# Patient Record
Sex: Male | Born: 1965 | Race: Black or African American | Hispanic: No | Marital: Married | Smoking: Current every day smoker
Health system: Southern US, Community
[De-identification: ages and names within clinical notes are randomized; demographics above are authoritative.]

## PROBLEM LIST (undated history)

## (undated) DIAGNOSIS — M329 Systemic lupus erythematosus, unspecified: Secondary | ICD-10-CM

## (undated) DIAGNOSIS — I200 Unstable angina: Secondary | ICD-10-CM

## (undated) DIAGNOSIS — E785 Hyperlipidemia, unspecified: Secondary | ICD-10-CM

## (undated) DIAGNOSIS — I10 Essential (primary) hypertension: Secondary | ICD-10-CM

## (undated) DIAGNOSIS — I2510 Atherosclerotic heart disease of native coronary artery without angina pectoris: Secondary | ICD-10-CM

## (undated) DIAGNOSIS — Z9861 Coronary angioplasty status: Secondary | ICD-10-CM

## (undated) DIAGNOSIS — I219 Acute myocardial infarction, unspecified: Secondary | ICD-10-CM

## (undated) HISTORY — PX: TOTAL HIP ARTHROPLASTY: SHX124

## (undated) HISTORY — PX: TOTAL SHOULDER REPLACEMENT: SUR1217

## (undated) HISTORY — PX: CORONARY ANGIOPLASTY WITH STENT PLACEMENT: SHX49

---

## 2005-08-13 DIAGNOSIS — I219 Acute myocardial infarction, unspecified: Secondary | ICD-10-CM

## 2005-08-13 HISTORY — DX: Acute myocardial infarction, unspecified: I21.9

## 2014-07-07 DIAGNOSIS — I200 Unstable angina: Secondary | ICD-10-CM

## 2014-07-07 HISTORY — DX: Unstable angina: I20.0

## 2014-11-08 ENCOUNTER — Emergency Department (HOSPITAL_BASED_OUTPATIENT_CLINIC_OR_DEPARTMENT_OTHER): Payer: Medicare Other

## 2014-11-08 ENCOUNTER — Other Ambulatory Visit (HOSPITAL_BASED_OUTPATIENT_CLINIC_OR_DEPARTMENT_OTHER): Payer: Self-pay

## 2014-11-08 ENCOUNTER — Inpatient Hospital Stay (HOSPITAL_BASED_OUTPATIENT_CLINIC_OR_DEPARTMENT_OTHER)
Admission: EM | Admit: 2014-11-08 | Discharge: 2014-11-10 | DRG: 247 | Disposition: A | Discharge status: Home / Self Care | Payer: Medicare Other | Attending: Internal Medicine | Admitting: Internal Medicine

## 2014-11-08 ENCOUNTER — Other Ambulatory Visit: Payer: Self-pay

## 2014-11-08 ENCOUNTER — Encounter (HOSPITAL_BASED_OUTPATIENT_CLINIC_OR_DEPARTMENT_OTHER): Payer: Self-pay

## 2014-11-08 DIAGNOSIS — T82857A Stenosis of cardiac prosthetic devices, implants and grafts, initial encounter: Secondary | ICD-10-CM | POA: Diagnosis present

## 2014-11-08 DIAGNOSIS — R10815 Periumbilic abdominal tenderness: Secondary | ICD-10-CM

## 2014-11-08 DIAGNOSIS — Z7982 Long term (current) use of aspirin: Secondary | ICD-10-CM | POA: Diagnosis not present

## 2014-11-08 DIAGNOSIS — F17210 Nicotine dependence, cigarettes, uncomplicated: Secondary | ICD-10-CM | POA: Diagnosis present

## 2014-11-08 DIAGNOSIS — Z96619 Presence of unspecified artificial shoulder joint: Secondary | ICD-10-CM | POA: Diagnosis present

## 2014-11-08 DIAGNOSIS — M329 Systemic lupus erythematosus, unspecified: Secondary | ICD-10-CM | POA: Diagnosis present

## 2014-11-08 DIAGNOSIS — I252 Old myocardial infarction: Secondary | ICD-10-CM | POA: Diagnosis not present

## 2014-11-08 DIAGNOSIS — R109 Unspecified abdominal pain: Secondary | ICD-10-CM

## 2014-11-08 DIAGNOSIS — I2510 Atherosclerotic heart disease of native coronary artery without angina pectoris: Secondary | ICD-10-CM | POA: Diagnosis present

## 2014-11-08 DIAGNOSIS — Z96649 Presence of unspecified artificial hip joint: Secondary | ICD-10-CM | POA: Diagnosis present

## 2014-11-08 DIAGNOSIS — Z9861 Coronary angioplasty status: Secondary | ICD-10-CM | POA: Diagnosis not present

## 2014-11-08 DIAGNOSIS — Z955 Presence of coronary angioplasty implant and graft: Secondary | ICD-10-CM

## 2014-11-08 DIAGNOSIS — I10 Essential (primary) hypertension: Secondary | ICD-10-CM | POA: Diagnosis present

## 2014-11-08 DIAGNOSIS — E876 Hypokalemia: Secondary | ICD-10-CM | POA: Diagnosis not present

## 2014-11-08 DIAGNOSIS — I214 Non-ST elevation (NSTEMI) myocardial infarction: Secondary | ICD-10-CM | POA: Diagnosis present

## 2014-11-08 DIAGNOSIS — Y831 Surgical operation with implant of artificial internal device as the cause of abnormal reaction of the patient, or of later complication, without mention of misadventure at the time of the procedure: Secondary | ICD-10-CM | POA: Diagnosis present

## 2014-11-08 DIAGNOSIS — R079 Chest pain, unspecified: Secondary | ICD-10-CM | POA: Diagnosis present

## 2014-11-08 DIAGNOSIS — E785 Hyperlipidemia, unspecified: Secondary | ICD-10-CM | POA: Diagnosis present

## 2014-11-08 DIAGNOSIS — G8929 Other chronic pain: Secondary | ICD-10-CM | POA: Diagnosis present

## 2014-11-08 DIAGNOSIS — Z7902 Long term (current) use of antithrombotics/antiplatelets: Secondary | ICD-10-CM

## 2014-11-08 DIAGNOSIS — T82855A Stenosis of coronary artery stent, initial encounter: Secondary | ICD-10-CM | POA: Diagnosis present

## 2014-11-08 HISTORY — DX: Essential (primary) hypertension: I10

## 2014-11-08 HISTORY — DX: Coronary angioplasty status: Z98.61

## 2014-11-08 HISTORY — DX: Systemic lupus erythematosus, unspecified: M32.9

## 2014-11-08 HISTORY — DX: Atherosclerotic heart disease of native coronary artery without angina pectoris: I25.10

## 2014-11-08 HISTORY — DX: Hyperlipidemia, unspecified: E78.5

## 2014-11-08 HISTORY — DX: Unstable angina: I20.0

## 2014-11-08 HISTORY — DX: Acute myocardial infarction, unspecified: I21.9

## 2014-11-08 LAB — CBC WITH DIFFERENTIAL/PLATELET
BASOS PCT: 0 % (ref 0–1)
Basophils Absolute: 0 10*3/uL (ref 0.0–0.1)
EOS ABS: 0 10*3/uL (ref 0.0–0.7)
Eosinophils Relative: 0 % (ref 0–5)
HCT: 38.9 % — ABNORMAL LOW (ref 39.0–52.0)
Hemoglobin: 13.2 g/dL (ref 13.0–17.0)
Lymphocytes Relative: 14 % (ref 12–46)
Lymphs Abs: 1.4 10*3/uL (ref 0.7–4.0)
MCH: 29.9 pg (ref 26.0–34.0)
MCHC: 33.9 g/dL (ref 30.0–36.0)
MCV: 88 fL (ref 78.0–100.0)
MONO ABS: 0.7 10*3/uL (ref 0.1–1.0)
Monocytes Relative: 7 % (ref 3–12)
NEUTROS PCT: 79 % — AB (ref 43–77)
Neutro Abs: 8.1 10*3/uL — ABNORMAL HIGH (ref 1.7–7.7)
Platelets: 206 10*3/uL (ref 150–400)
RBC: 4.42 MIL/uL (ref 4.22–5.81)
RDW: 14.2 % (ref 11.5–15.5)
WBC: 10.2 10*3/uL (ref 4.0–10.5)

## 2014-11-08 LAB — URINALYSIS, ROUTINE W REFLEX MICROSCOPIC
BILIRUBIN URINE: NEGATIVE
Glucose, UA: NEGATIVE mg/dL
Hgb urine dipstick: NEGATIVE
Ketones, ur: 40 mg/dL — AB
LEUKOCYTES UA: NEGATIVE
NITRITE: NEGATIVE
Protein, ur: 30 mg/dL — AB
Specific Gravity, Urine: 1.033 — ABNORMAL HIGH (ref 1.005–1.030)
Urobilinogen, UA: 0.2 mg/dL (ref 0.0–1.0)
pH: 5 (ref 5.0–8.0)

## 2014-11-08 LAB — COMPREHENSIVE METABOLIC PANEL
ALBUMIN: 4.8 g/dL (ref 3.5–5.0)
ALT: 21 U/L (ref 17–63)
AST: 41 U/L (ref 15–41)
Alkaline Phosphatase: 87 U/L (ref 38–126)
Anion gap: 14 (ref 5–15)
BILIRUBIN TOTAL: 0.5 mg/dL (ref 0.3–1.2)
BUN: 23 mg/dL — ABNORMAL HIGH (ref 6–20)
CHLORIDE: 105 mmol/L (ref 101–111)
CO2: 19 mmol/L — AB (ref 22–32)
Calcium: 9.3 mg/dL (ref 8.9–10.3)
Creatinine, Ser: 1.11 mg/dL (ref 0.61–1.24)
GFR calc Af Amer: >60 mL/min (ref >60)
GFR calc non Af Amer: >60 mL/min (ref >60)
GLUCOSE: 137 mg/dL — AB (ref 70–99)
Potassium: 4.5 mmol/L (ref 3.5–5.1)
Sodium: 138 mmol/L (ref 135–145)
Total Protein: 9.4 g/dL — ABNORMAL HIGH (ref 6.5–8.1)

## 2014-11-08 LAB — TROPONIN I
TROPONIN I: 0.63 ng/mL — AB (ref <0.031)
Troponin I: 1.15 ng/mL (ref <0.031)
Troponin I: 1.69 ng/mL (ref <0.031)

## 2014-11-08 LAB — URINE MICROSCOPIC-ADD ON

## 2014-11-08 LAB — CBG MONITORING, ED: GLUCOSE-CAPILLARY: 119 mg/dL — AB (ref 70–99)

## 2014-11-08 LAB — OCCULT BLOOD GASTRIC / DUODENUM (SPECIMEN CUP)
Occult Blood, Gastric: POSITIVE — AB
PH, GASTRIC: 4

## 2014-11-08 LAB — LIPASE, BLOOD: LIPASE: 24 U/L (ref 22–51)

## 2014-11-08 MED ORDER — HYDROCORTISONE 1 % EX OINT
1.0000 "application " | TOPICAL_OINTMENT | Freq: Every day | CUTANEOUS | Status: DC
  Administered 2014-11-08: 1 via TOPICAL
  Filled 2014-11-08: qty 28.35

## 2014-11-08 MED ORDER — CLOPIDOGREL BISULFATE 75 MG PO TABS
75.0000 mg | ORAL_TABLET | Freq: Every day | ORAL | Status: DC
  Administered 2014-11-09: 75 mg via ORAL
  Filled 2014-11-08 (×2): qty 1

## 2014-11-08 MED ORDER — ONDANSETRON HCL 4 MG/2ML IJ SOLN
4.0000 mg | Freq: Four times a day (QID) | INTRAMUSCULAR | Status: DC | PRN
  Administered 2014-11-08: 4 mg via INTRAVENOUS
  Filled 2014-11-08: qty 2

## 2014-11-08 MED ORDER — HYDROXYCHLOROQUINE SULFATE 200 MG PO TABS
200.0000 mg | ORAL_TABLET | Freq: Two times a day (BID) | ORAL | Status: DC
  Administered 2014-11-08 – 2014-11-09 (×3): 200 mg via ORAL
  Filled 2014-11-08 (×5): qty 1

## 2014-11-08 MED ORDER — NITROGLYCERIN 0.4 MG SL SUBL: 0.4000 mg | SUBLINGUAL_TABLET | SUBLINGUAL | Status: DC | PRN

## 2014-11-08 MED ORDER — MORPHINE SULFATE 4 MG/ML IJ SOLN
4.0000 mg | Freq: Once | INTRAMUSCULAR | Status: AC
  Administered 2014-11-08: 4 mg via INTRAVENOUS
  Filled 2014-11-08: qty 1

## 2014-11-08 MED ORDER — IOHEXOL 300 MG/ML  SOLN
100.0000 mL | Freq: Once | INTRAMUSCULAR | Status: AC | PRN
Start: 2014-11-08 — End: 2014-11-08
  Administered 2014-11-08: 100 mL via INTRAVENOUS

## 2014-11-08 MED ORDER — HEPARIN SODIUM (PORCINE) 5000 UNIT/ML IJ SOLN: 4000.0000 [IU] | Freq: Once | INTRAMUSCULAR | Status: DC

## 2014-11-08 MED ORDER — ASPIRIN 81 MG PO CHEW
324.0000 mg | CHEWABLE_TABLET | Freq: Once | ORAL | Status: AC
  Administered 2014-11-08: 324 mg via ORAL
  Filled 2014-11-08: qty 4

## 2014-11-08 MED ORDER — ALPRAZOLAM 0.25 MG PO TABS: 0.2500 mg | ORAL_TABLET | Freq: Two times a day (BID) | ORAL | Status: DC | PRN

## 2014-11-08 MED ORDER — ASPIRIN EC 81 MG PO TBEC
162.0000 mg | DELAYED_RELEASE_TABLET | Freq: Every day | ORAL | Status: DC
  Administered 2014-11-09: 162 mg via ORAL
  Filled 2014-11-08 (×3): qty 2

## 2014-11-08 MED ORDER — HEPARIN BOLUS VIA INFUSION
4000.0000 [IU] | Freq: Once | INTRAVENOUS | Status: AC
  Administered 2014-11-08: 4000 [IU] via INTRAVENOUS

## 2014-11-08 MED ORDER — PRAVASTATIN SODIUM 40 MG PO TABS
40.0000 mg | ORAL_TABLET | Freq: Every day | ORAL | Status: DC
  Administered 2014-11-09: 21:00:00 40 mg via ORAL
  Filled 2014-11-08: qty 1

## 2014-11-08 MED ORDER — SODIUM CHLORIDE 0.9 % IV BOLUS (SEPSIS)
1000.0000 mL | Freq: Once | INTRAVENOUS | Status: AC
  Administered 2014-11-08: 1000 mL via INTRAVENOUS

## 2014-11-08 MED ORDER — CLOBETASOL PROPIONATE 0.05 % EX CREA
1.0000 "application " | TOPICAL_CREAM | Freq: Every day | CUTANEOUS | Status: DC
  Administered 2014-11-08: 1 via TOPICAL
  Filled 2014-11-08: qty 15

## 2014-11-08 MED ORDER — HEPARIN (PORCINE) IN NACL 100-0.45 UNIT/ML-% IJ SOLN
INTRAMUSCULAR | Status: AC
  Filled 2014-11-08: qty 250

## 2014-11-08 MED ORDER — LISINOPRIL 10 MG PO TABS
20.0000 mg | ORAL_TABLET | Freq: Every day | ORAL | Status: DC
  Administered 2014-11-09: 20 mg via ORAL
  Filled 2014-11-08: qty 2
  Filled 2014-11-08: qty 1
  Filled 2014-11-08: qty 2

## 2014-11-08 MED ORDER — METOPROLOL SUCCINATE ER 50 MG PO TB24
50.0000 mg | ORAL_TABLET | Freq: Every day | ORAL | Status: DC
  Administered 2014-11-09: 50 mg via ORAL
  Filled 2014-11-08 (×2): qty 1

## 2014-11-08 MED ORDER — ZOLPIDEM TARTRATE 5 MG PO TABS
5.0000 mg | ORAL_TABLET | Freq: Every evening | ORAL | Status: DC | PRN
  Administered 2014-11-08: 5 mg via ORAL
  Filled 2014-11-08: qty 1

## 2014-11-08 MED ORDER — GABAPENTIN 600 MG PO TABS
600.0000 mg | ORAL_TABLET | Freq: Three times a day (TID) | ORAL | Status: DC
  Administered 2014-11-08 – 2014-11-09 (×4): 600 mg via ORAL
  Filled 2014-11-08 (×7): qty 1

## 2014-11-08 MED ORDER — DULOXETINE HCL 60 MG PO CPEP
60.0000 mg | ORAL_CAPSULE | Freq: Every day | ORAL | Status: DC
  Administered 2014-11-09: 60 mg via ORAL
  Filled 2014-11-08 (×2): qty 1

## 2014-11-08 MED ORDER — HEPARIN (PORCINE) IN NACL 100-0.45 UNIT/ML-% IJ SOLN
1150.0000 [IU]/h | INTRAMUSCULAR | Status: DC
  Administered 2014-11-08: 1150 [IU]/h via INTRAVENOUS
  Filled 2014-11-08: qty 250

## 2014-11-08 MED ORDER — CLOPIDOGREL BISULFATE 75 MG PO TABS
75.0000 mg | ORAL_TABLET | Freq: Once | ORAL | Status: AC
  Administered 2014-11-08: 75 mg via ORAL
  Filled 2014-11-08: qty 1

## 2014-11-08 MED ORDER — ACETAMINOPHEN 325 MG PO TABS: 650.0000 mg | ORAL_TABLET | ORAL | Status: DC | PRN

## 2014-11-08 MED ORDER — OXYCODONE HCL 5 MG PO TABS: 5.0000 mg | ORAL_TABLET | ORAL | Status: DC | PRN

## 2014-11-08 MED ORDER — ONDANSETRON HCL 4 MG/2ML IJ SOLN
4.0000 mg | Freq: Once | INTRAMUSCULAR | Status: AC
Start: 2014-11-08 — End: 2014-11-08
  Administered 2014-11-08: 4 mg via INTRAVENOUS
  Filled 2014-11-08: qty 2

## 2014-11-08 MED ORDER — TIZANIDINE HCL 4 MG PO TABS
4.0000 mg | ORAL_TABLET | Freq: Three times a day (TID) | ORAL | Status: DC
  Administered 2014-11-08 – 2014-11-09 (×4): 4 mg via ORAL
  Filled 2014-11-08 (×9): qty 1

## 2014-11-08 MED ORDER — IOHEXOL 300 MG/ML  SOLN
25.0000 mL | Freq: Once | INTRAMUSCULAR | Status: AC | PRN
  Administered 2014-11-08: 25 mL via ORAL

## 2014-11-08 NOTE — ED Notes (Addendum)
Dr. Jodi Mourningzavitz and Sue LushAndrea, primary nurse, alerted to critical troponin value of 0.63.

## 2014-11-08 NOTE — Progress Notes (Signed)
Paged Cardiology Fellow Varo with trop of 1.69

## 2014-11-08 NOTE — ED Notes (Signed)
MD aware trop. Marland Kitchen.6

## 2014-11-08 NOTE — H&P (Signed)
History and Physical   Patient ID: Christopher Nunez MRN: [id number], DOB/AGE: 48-04-1966 48 y.o. Date of Encounter: 11/08/2014  Primary Physician: Ananias Pilgrim, MD Primary Cardiologist: Dr Desma Maxim  Chief Complaint:  Abd pain, elevated enzymes  HPI: Christopher Nunez is a 49 y.o. male with a history of CAD. Last pm, he developed chills, diaphoresis, N&V. The symptoms lasted all night long. Seen by EMS in the night, but not transported. CBG may have been low. No diarrhea.   Pt had noticed a burning sensation with exertion for about a week, total of 2 episodes. He rested and used SL NTG spray with relief of symptoms. Previous cardiac symptoms was throbbing, heart beating fast.   Pt developed pain in the lower part of his abdomen from all the vomiting but did not have any chest discomfort.   When the diaphoresis, N&V did not resolve, a friend took he and his wife to [facility]. His cardiac enzymes were elevated and trending up, so he was transferred to [facility]. He has not eaten today. The last time he vomited was just before he left [facility].    Past Medical History  Diagnosis Date  . Hypertension   . Myocardial infarction 08/13/2005    Stent prob to RCA  . Lupus   . Unstable angina pectoris 07/07/2014    2.5 x 18 mm Resolute Integrity DES to RCA, for ISR per wife    Surgical History:  Past Surgical History  Procedure Laterality Date  . Coronary angioplasty with stent placement      2005 & 2015  . Total hip arthroplasty    . Total shoulder replacement       I have reviewed the patient's current medications. Prior to Admission medications    Prior to Admission medications   Medication Sig Start Date End Date Taking? Authorizing Provider  gabapentin (NEURONTIN) 600 MG tablet Take 600 mg by mouth 3 (three) times daily.   Yes Historical Provider, MD  hydroxychloroquine (PLAQUENIL) 200 MG tablet Take 200 mg by mouth 2 (two) times daily.   Yes Historical Provider, MD  lisinopril  (PRINIVIL,ZESTRIL) 20 MG tablet Take 20 mg by mouth daily.   Yes Historical Provider, MD  tiZANidine (ZANAFLEX) 4 MG tablet Take 4 mg by mouth 3 (three) times daily as needed for muscle spasms.    Yes Historical Provider, MD  clopidogrel (PLAVIX) 75 MG tablet Take 75 mg by mouth daily.    Historical Provider, MD  DULoxetine (CYMBALTA) 60 MG capsule Take 60 mg by mouth daily.    Historical Provider, MD  metoprolol succinate (TOPROL-XL) 50 MG 24 hr tablet Take 50 mg by mouth daily. Take with or immediately following a meal.    Historical Provider, MD  nitroGLYCERIN (NITROLINGUAL) 0.4 MG/SPRAY spray Place 1 spray under the tongue every 5 (five) minutes x 3 doses as needed for chest pain.    Historical Provider, MD  oxycodone (OXY-IR) 5 MG capsule Take 5 mg by mouth every 4 (four) hours as needed.    Historical Provider, MD  pravastatin (PRAVACHOL) 40 MG tablet Take 40 mg by mouth daily.    Historical Provider, MD    Scheduled Meds: . heparin       Continuous Infusions: . heparin 1,150 Units/hr (11/08/14 1602)   PRN Meds:.  Allergies:  Allergies  Allergen Reactions  . Hydrocodone Itching    History   Social History  . Marital Status: Married    Spouse Name: N/A  . Number of Children:  N/A  . Years of Education: N/A   Occupational History  . Disabled     since 2007   Social History Main Topics  . Smoking status: Current Every Day Smoker -- 0.50 packs/day for 30 years    Types: Cigarettes  . Smokeless tobacco: Never Used  . Alcohol Use: 0.0 oz/week    0 Standard drinks or equivalent per week     Comment: occasionally  . Drug Use: No  . Sexual Activity: Not on file   Other Topics Concern  . Not on file   Social History Narrative   Lives with wife. No cardiac problems in parents or siblings.    No family history on file. Family Status  Relation Status Death Age  . Mother Alive   . Father Alive     Review of Systems:   Full 14-point review of systems otherwise  negative except as noted above.  Physical Exam: Blood pressure 150/92, pulse 68, temperature 98.9 F (37.2 C), temperature source Oral, resp. rate 18, height 6\' 1"  (1.854 m), weight 175 lb 14.8 oz (79.8 kg), SpO2 100 %. General: Well developed, well nourished,male in no acute distress. Head: Normocephalic, atraumatic, sclera non-icteric, no xanthomas, nares are without discharge. Dentition:  Neck: No carotid bruits. JVD not elevated. No thyromegally Lungs: Good expansion bilaterally. without wheezes or rhonchi.  Heart: IRRegular rate and rhythm with S1 S2.  No S3 or S4.  No murmur, no rubs, or gallops appreciated. Abdomen: Soft, non-tender, non-distended with normoactive bowel sounds. No hepatomegaly. No rebound/guarding. No obvious abdominal masses. Msk:  Strength and tone appear normal for age. No joint deformities or effusions, no spine or costo-vertebral angle tenderness. Extremities: No clubbing or cyanosis. No edema.  Distal pedal pulses are 2+ in 4 extrem Neuro: Alert and oriented X 3. Moves all extremities spontaneously. No focal deficits noted. Psych:  Responds to questions appropriately with a normal affect. Skin: No rashes or lesions noted  Labs:   Lab Results  Component Value Date   WBC 10.2 11/08/2014   HGB 13.2 11/08/2014   HCT 38.9* 11/08/2014   MCV 88.0 11/08/2014   PLT 206 11/08/2014    Recent Labs Lab 11/08/14 1225  NA 138  K 4.5  CL 105  CO2 19*  BUN 23*  CREATININE 1.11  CALCIUM 9.3  PROT 9.4*  BILITOT 0.5  ALKPHOS 87  ALT 21  AST 41  GLUCOSE 137*    Recent Labs  11/08/14 1225 11/08/14 1455  TROPONINI 0.63* 1.15*   Radiology/Studies: Ct Abdomen Pelvis W Contrast 11/08/2014   CLINICAL DATA:  49 year old male with abdominal pain nausea and vomiting since last night. Current history of lupus.  EXAM: CT ABDOMEN AND PELVIS WITH CONTRAST  TECHNIQUE: Multidetector CT imaging of the abdomen and pelvis was performed using the standard protocol following  bolus administration of intravenous contrast.  CONTRAST:  100mL OMNIPAQUE IOHEXOL 300 MG/ML  SOLN  COMPAR[facility]:  [facility] 09/25/2013. Noncontrast CT Abdomen and Pelvis 12/10/2005.  FINDINGS: Occasional cystic changes in the visible left lower lobe appear increased since 2007 (series 5, image 1). Mild dependent atelectasis in the right lower lobe. No pleural or pericardial effusion.  Bilateral hip arthroplasty sequelae. Subsequent streak artifact through the pelvis. No acute osseous abnormality identified.  No pelvic free fluid. The rectum appears decompressed. Grossly negative bladder.  Abnormal appearance of the left inguinal canal is new from prior studies, but not definitely related to an inguinal hernia. Oval intermediate density  mass encompassing 18 mm diameter and about 6 cm in length. See series 3, image 81 and compare to series 2, image 41 and 2015. The left inguinal canal was normal in 2007.  Redundant but otherwise negative sigmoid colon. Retained stool throughout much of the colon. Redundant hepatic flexure. Occasional diverticula. No large bowel inflammation. Normal appendix. No dilated small bowel. Negative terminal ileum. Negative stomach and duodenum.  Liver, gallbladder, spleen, pancreas and adrenal glands are within normal limits. Normal portal venous system. Aortoiliac calcified atherosclerosis noted. Major arterial structures are patent. No abdominal free fluid. No lymphadenopathy identified. Bilateral renal enhancement and contrast excretion within normal limits.  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  IMPRESSION: 1. New since 2015 6 cm long by 1-2 cm thick intermediate soft tissue density associated with the left inguinal ligament. This is not appeared to be related to an inguinal hernia. Recommend followup scrotal ultrasound. 2. Otherwise no acute or inflammatory finding in the abdomen or pelvis.    Electronically Signed   By: Odessa Fleming M.D.   On: 11/08/2014 14:52     ECG: SR, nonspecific ST changes, no old.  ASSESSMENT AND PLAN:  Active Problems:   NSTEMI (non-ST elevated myocardial infarction) - continue current rx with ASA, heparin, BB, Plavix and statin as GI symptoms will allow. - cycle enzymes - get records from [facility] - plan cath in am    N&V - Treat symptomatically - may be 2nd to MI  Signed, Leanna Battles 11/08/2014 6:52 PM Beeper [phone]  Patient seen and examined  I agree with findings as noted above by R Barrett.  Patient with history of CAD   Underwent intervention at [facility] in December for instent restenosis.  Now with N/V, sweats  Different from symptoms he had in Dec  Seen in ER today ([facility])  Trop are elevated. Currently on IV heparin and symptom free  Would continue to cycle enzymes Give additional plavix (vomited today.  Didn't see pill, but want to ensure absorption) Plan for L heart cath in AM  Patient agrees to proceed.  Dietrich Pates

## 2014-11-08 NOTE — ED Notes (Addendum)
Pt actively vomiting upon entering room. Pt extremely diaphoretic. Medications given, instant relief. Sample obtained and gastrocult sent for eval to lab

## 2014-11-08 NOTE — ED Notes (Signed)
[facility] pharm Called to verify need for heparin considering GAstrocult positive, PA made aware

## 2014-11-08 NOTE — ED Provider Notes (Signed)
CSN: [id number]     Arrival date & time 11/08/14  1210 History   First MD Initiated Contact with Patient 11/08/14 1224     Chief Complaint  Patient presents with  . Abdominal Pain     (Consider location/radiation/quality/duration/timing/severity/associated sxs/prior Treatment) HPI  Christopher Nunez is a 49 y.o. male with PMH of hypertension, myocardial infarction 2007 after stent occlusion on baby aspirin and Plavix presenting with periumbilical sharp abdominal pain that started last night with associated emesis that is nonbloody not hematemesis. It consists of food contents or brown. Pt also reports associated diaphoresis. Abdominal pain comes and goes the denies alleviating or aggravating factors. He denies fevers but reports chills and feeling very cold. No melanotic stool or hematochezia. Last BM yesterday and normal. Last night was his wife's birthday and reported drinking 8-9 beers. No NSAID use. Patient does go to chronic pain for lupus and takes 5 oxycodone 3 times a day. He's been taking as prescribed and no changes in his medications. No history of abdominal surgeries. No chest pain or SOB.   Past Medical History  Diagnosis Date  . Myocardial infarction 08/13/2005    Stent prob to RCA  . Unstable angina pectoris 07/07/2014    RCA stent ISR -- PCI  . CAD S/P percutaneous coronary angioplasty     2007: PCI to RCA, 06/2014: 2.5 x 18 mm Resolute Integrity DES to RCA, for ISR per wife  . Essential hypertension     poorly controlled  . Hyperlipidemia with target LDL less than 70   . Lupus (systemic lupus erythematosus)    Past Surgical History  Procedure Laterality Date  . Coronary angioplasty with stent placement      2005 & 2015  . Total hip arthroplasty    . Total shoulder replacement     No family history on file. History  Substance Use Topics  . Smoking status: Current Every Day Smoker -- 0.50 packs/day for 30 years    Types: Cigarettes  . Smokeless tobacco: Never Used   . Alcohol Use: 0.0 oz/week    0 Standard drinks or equivalent per week     Comment: occasionally    Review of Systems 10 Systems reviewed and are negative for acute change except as noted in the HPI.    Allergies  Hydrocodone  Home Medications   Prior to Admission medications   Medication Sig Start Date End Date Taking? Authorizing Provider  aspirin EC 81 MG tablet Take 162 mg by mouth daily.   Yes Historical Provider, MD  clobetasol cream (TEMOVATE) 0.05 % Apply 1 application topically daily. Apply to head for rash from lupus   Yes Historical Provider, MD  clopidogrel (PLAVIX) 75 MG tablet Take 75 mg by mouth daily.   Yes Historical Provider, MD  DULoxetine (CYMBALTA) 60 MG capsule Take 60 mg by mouth daily.   Yes Historical Provider, MD  gabapentin (NEURONTIN) 600 MG tablet Take 600 mg by mouth 3 (three) times daily.   Yes Historical Provider, MD  hydrocortisone 2.5 % ointment Apply 1 application topically daily. Apply a thin layer to face for rash from lupus   Yes Historical Provider, MD  hydroxychloroquine (PLAQUENIL) 200 MG tablet Take 200 mg by mouth 2 (two) times daily.   Yes Historical Provider, MD  lisinopril (PRINIVIL,ZESTRIL) 20 MG tablet Take 20 mg by mouth daily.   Yes Historical Provider, MD  metoprolol succinate (TOPROL-XL) 50 MG 24 hr tablet Take 50 mg by mouth daily. Take with or immediately  following a meal.   Yes Historical Provider, MD  nitroGLYCERIN (NITROLINGUAL) 0.4 MG/SPRAY spray Place 1 spray under the tongue every 5 (five) minutes x 3 doses as needed for chest pain.   Yes Historical Provider, MD  nitroGLYCERIN (NITROSTAT) 0.4 MG SL tablet Place 0.4 mg under the tongue every 5 (five) minutes as needed for chest pain.   Yes Historical Provider, MD  oxycodone (OXY-IR) 5 MG capsule Take 5 mg by mouth 3 (three) times daily. scheduled   Yes Historical Provider, MD  pravastatin (PRAVACHOL) 40 MG tablet Take 40 mg by mouth daily.   Yes Historical Provider, MD   tiZANidine (ZANAFLEX) 4 MG tablet Take 4 mg by mouth 3 (three) times daily.    Yes Historical Provider, MD   BP 155/84 mmHg  Pulse 53  Temp(Src) 98.7 F (37.1 C) (Oral)  Resp 18  Ht  (1.854 m)  Wt 174 lb 11.2 oz (79.243 kg)  BMI 23.05 kg/m2  SpO2 100% Physical Exam  Constitutional: He appears well-developed and well-nourished. No distress.  HENT:  Head: Normocephalic and atraumatic.  Dry mucous membranes.  Eyes: Conjunctivae and EOM are normal. Right eye exhibits no discharge. Left eye exhibits no discharge.  Cardiovascular: Normal rate and regular rhythm.   Pulmonary/Chest: Effort normal and breath sounds normal. No respiratory distress. He has no wheezes.  Abdominal: Soft. Bowel sounds are normal. He exhibits no distension.  Marland Kitchen Umbilical abdominal pain without rebound, rigidity with voluntary guarding.  Neurological: He is alert. He exhibits normal muscle tone. Coordination normal.  Skin: Skin is warm and dry. He is not diaphoretic.  Nursing note and vitals reviewed.   ED Course  Procedures (including critical care time) Labs Review Labs Reviewed  CBC WITH DIFFERENTIAL/PLATELET - Abnormal; Notable for the following:    HCT 38.9 (*)    Neutrophils Relative % 79 (*)    Neutro Abs 8.1 (*)    All other components within normal limits  COMPREHENSIVE METABOLIC PANEL - Abnormal; Notable for the following:    CO2 19 (*)    Glucose, Bld 137 (*)    BUN 23 (*)    Total Protein 9.4 (*)    All other components within normal limits  TROPONIN I - Abnormal; Notable for the following:    Troponin I 0.63 (*)    All other components within normal limits  URINALYSIS, ROUTINE W REFLEX MICROSCOPIC - Abnormal; Notable for the following:    Specific Gravity, Urine 1.033 (*)    Ketones, ur 40 (*)    Protein, ur 30 (*)    All other components within normal limits  OCCULT BLOOD GASTRIC / DUODENUM (SPECIMEN CUP) - Abnormal; Notable for the following:    Occult Blood, Gastric POSITIVE  (*)    All other components within normal limits  URINE MICROSCOPIC-ADD ON - Abnormal; Notable for the following:    Casts GRANULAR CAST (*)    All other components within normal limits  TROPONIN I - Abnormal; Notable for the following:    Troponin I 1.15 (*)    All other components within normal limits  TROPONIN I - Abnormal; Notable for the following:    Troponin I 1.69 (*)    All other components within normal limits  TROPONIN I - Abnormal; Notable for the following:    Troponin I 1.79 (*)    All other components within normal limits  LIPID PANEL - Abnormal; Notable for the following:    HDL 33 (*)  All other components within normal limits  HEPARIN LEVEL (UNFRACTIONATED) - Abnormal; Notable for the following:    Heparin Unfractionated 0.75 (*)    All other components within normal limits  CBC - Abnormal; Notable for the following:    RBC 4.06 (*)    Hemoglobin 11.9 (*)    HCT 35.1 (*)    All other components within normal limits  CBG MONITORING, ED - Abnormal; Notable for the following:    Glucose-Capillary 119 (*)    All other components within normal limits  LIPASE, BLOOD  TSH  PROTIME-INR  TROPONIN I  BASIC METABOLIC PANEL    Imaging Review Ct Abdomen Pelvis W Contrast  11/08/2014   CLINICAL DATA:  49 year old male with abdominal pain nausea and vomiting since last night. Current history of lupus.  EXAM: CT ABDOMEN AND PELVIS WITH CONTRAST  TECHNIQUE: Multidetector CT imaging of the abdomen and pelvis was performed using the standard protocol following bolus administration of intravenous contrast.  CONTRAST:  100mL OMNIPAQUE IOHEXOL 300 MG/ML  SOLN  COMPARISON:  [facility] pelvis CT 09/25/2013. Noncontrast CT Abdomen and Pelvis 12/10/2005.  FINDINGS: Occasional cystic changes in the visible left lower lobe appear increased since 2007 (series 5, image 1). Mild dependent atelectasis in the right lower lobe. No pleural or pericardial effusion.  Bilateral  hip arthroplasty sequelae. Subsequent streak artifact through the pelvis. No acute osseous abnormality identified.  No pelvic free fluid. The rectum appears decompressed. Grossly negative bladder.  Abnormal appearance of the left inguinal canal is new from prior studies, but not definitely related to an inguinal hernia. Oval intermediate density mass encompassing 18 mm diameter and about 6 cm in length. See series 3, image 81 and compare to series 2, image 41 and 2015. The left inguinal canal was normal in 2007.  Redundant but otherwise negative sigmoid colon. Retained stool throughout much of the colon. Redundant hepatic flexure. Occasional diverticula. No large bowel inflammation. Normal appendix. No dilated small bowel. Negative terminal ileum. Negative stomach and duodenum.  Liver, gallbladder, spleen, pancreas and adrenal glands are within normal limits. Normal portal venous system. Aortoiliac calcified atherosclerosis noted. Major arterial structures are patent. No abdominal free fluid. No lymphadenopathy identified. Bilateral renal enhancement and contrast excretion within normal limits.  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  IMPRESSION: 1. New since 2015 6 cm long by 1-2 cm thick intermediate soft tissue density associated with the left inguinal ligament. This is not appeared to be related to an inguinal hernia. Recommend followup scrotal ultrasound. 2. Otherwise no acute or inflammatory finding in the abdomen or pelvis.   Electronically Signed   By: Odessa FlemingH  Hall M.D.   On: 11/08/2014 14:52     EKG Interpretation None      ED ECG REPORT   Date: 11/08/2014  Rate: 68  Rhythm: normal sinus rhythm  QRS Axis: normal  Intervals: normal  ST/T Wave abnormalities: nonspecific ST changes  Conduction Disutrbances:none  Narrative Interpretation: NSR, possible left atrial enlargement, nonspecific ST abnormality   CRITICAL CARE Performed by: Maleiah Dula,  Raiana Pharris   Total critical care time: 35 Critical care time was exclusive of separately billable procedures and treating other patients.  Critical care was necessary to treat or prevent imminent or life-threatening deterioration.  Critical care was time spent personally by me on the following activities: development of treatment plan with patient and/or surrogate as well as nursing, discussions with consultants, evaluation of  patient's response to treatment, examination of patient, obtaining history from patient or surrogate, ordering and performing treatments and interventions, ordering and review of laboratory studies, ordering and review of radiographic studies, pulse oximetry and re-evaluation of patient's condition.   MDM   Final diagnoses:  Periumbilic abdominal tenderness  NSTEMI (non-ST elevated myocardial infarction)   Pt with CAD and initial stent placement 2007 with occlusion requiring stenting 2015. Pt presenting with periumbilical pain with nausea vomiting and diaphoresis. Pt presented with initial HTN and tachycardia that resolved. Mild abdominal tenderness without evidence of peritonitis. Basic abdominal labs ordered as well as troponin due to patient's diaphoresis and heart history. NS, morphine and zofran ordered. CT ordered.  13:27 Pt troponin elevated at 0.63. Pt denies any chest pain, SOB or development of these symptoms. No further active vomiting in ED. Pt denies any pain. Pt stated with prior MI patient had chest pain. Case discussed with Dr. Jodi Mourning. EKG with NSR nonspecific ST abnormality (depression inferiorly and laterally without ST elevation). Plan for repeat troponin, EKG. Aspirin ordered. administered.  15:41 Repeat troponin trending up at 1.15. Repeat EKG without significant change. Concern for NSTEMI. No development of chest pain or SOB. IV heparin ordered. No further vomiting in ED. Pt with Gastroccult positive with stable hemoglobin. Pt stable and the benefits of  heparin in the setting of possible NSTEMI outweigh the risks of GI complication at this time. CT without acute abdomen pelvis abnormality. Abdominal pain likely related to possible NSTEMI. Consult to cardiology. Spoke with Dr. Dietrich Pates who agrees to accept patient in transfer to [facility] for further cardiac workup.  Discussed all results and patient verbalizes understanding and agrees with plan.  This is a shared patient. This patient was discussed with the physician who saw and evaluated the patient and agrees with the plan.   Oswaldo Conroy, PA-C 11/09/14 [id number]  Oswaldo Conroy, PA-C 11/09/14 1478  Blane Ohara, MD 11/10/14 [phone]

## 2014-11-08 NOTE — Progress Notes (Signed)
ANTICOAGULATION CONSULT NOTE - Initial Consult  Pharmacy Consult for Heparin  Indication: chest pain/ACS  No Known Allergies  Patient Measurements: Height: 6\' 1"  (185.4 cm) Weight: 193 lb (87.544 kg) IBW/kg (Calculated) : 79.9 Heparin Dosing Weight: 87.5 kg   Vital Signs: Temp: 98.3 F (36.8 C) (05/02 1217) Temp Source: Oral (05/02 1217) BP: 153/86 mmHg (05/02 1412) Pulse Rate: 66 (05/02 1412)  Labs:  Recent Labs  11/08/14 1225 11/08/14 1455  HGB 13.2  --   HCT 38.9*  --   PLT 206  --   CREATININE 1.11  --   TROPONINI 0.63* 1.15*    Estimated Creatinine Clearance: 92 mL/min (by C-G formula based on Cr of 1.11).   Medical History: Past Medical History  Diagnosis Date  . Hypertension   . Myocardial infarction   . Lupus     Medications:   (Not in a hospital admission)  Assessment: [age] YOM who presented with sharp abdominal pain that started last night with associated emesis. On admission, patient was found to have elevated troponins that were trending up and concerning for NSTEMI. Hgb and platelets are wnl.   Goal of Therapy:  Heparin level 0.3-0.7 units/ml Monitor platelets by anticoagulation protocol: Yes   Plan:  -Give heparin 4000 units IV bolus, then start heparin 1150 units/hr  -F/u 6 hr HL -Monitor CBC, daily HL and s/s of bleeding  Vinnie LevelBenjamin Rue Valladares, PharmD., BCPS Clinical Pharmacist Pager [phone]

## 2014-11-08 NOTE — ED Notes (Addendum)
Abd pain, vomiting (denies diarrhea) started last night-wife states EMS came out- wife reported normal EKG but low BS-pt with profuse sweating

## 2014-11-08 NOTE — ED Notes (Signed)
Dr. Jodi Mourningzavitz notified of critical troponin of 1.15.

## 2014-11-08 NOTE — ED Notes (Signed)
[facility] at bedside for transport. 

## 2014-11-09 ENCOUNTER — Encounter (HOSPITAL_COMMUNITY)
Admission: EM | Disposition: A | Discharge status: Home / Self Care | Payer: Medicare Other | Source: Home / Self Care | Attending: Internal Medicine

## 2014-11-09 ENCOUNTER — Ambulatory Visit (HOSPITAL_COMMUNITY): Admit: 2014-11-09 | Payer: Self-pay | Admitting: Cardiology

## 2014-11-09 DIAGNOSIS — T82855A Stenosis of coronary artery stent, initial encounter: Secondary | ICD-10-CM | POA: Diagnosis present

## 2014-11-09 DIAGNOSIS — I2510 Atherosclerotic heart disease of native coronary artery without angina pectoris: Secondary | ICD-10-CM

## 2014-11-09 DIAGNOSIS — Z9861 Coronary angioplasty status: Secondary | ICD-10-CM

## 2014-11-09 DIAGNOSIS — M329 Systemic lupus erythematosus, unspecified: Secondary | ICD-10-CM

## 2014-11-09 HISTORY — PX: CARDIAC CATHETERIZATION: SHX172

## 2014-11-09 HISTORY — PX: PERCUTANEOUS CORONARY STENT INTERVENTION (PCI-S): SHX5485

## 2014-11-09 LAB — CBC
HEMATOCRIT: 35.1 % — AB (ref 39.0–52.0)
HEMOGLOBIN: 11.9 g/dL — AB (ref 13.0–17.0)
MCH: 29.3 pg (ref 26.0–34.0)
MCHC: 33.9 g/dL (ref 30.0–36.0)
MCV: 86.5 fL (ref 78.0–100.0)
Platelets: 197 10*3/uL (ref 150–400)
RBC: 4.06 MIL/uL — AB (ref 4.22–5.81)
RDW: 14.2 % (ref 11.5–15.5)
WBC: 10.2 10*3/uL (ref 4.0–10.5)

## 2014-11-09 LAB — TROPONIN I
TROPONIN I: 1.55 ng/mL — AB (ref <0.031)
Troponin I: 1.79 ng/mL (ref <0.031)

## 2014-11-09 LAB — GLUCOSE, CAPILLARY: GLUCOSE-CAPILLARY: 104 mg/dL — AB (ref 70–99)

## 2014-11-09 LAB — LIPID PANEL
CHOLESTEROL: 107 mg/dL (ref 0–200)
HDL: 33 mg/dL — AB (ref >40)
LDL Cholesterol: 59 mg/dL (ref 0–99)
Total CHOL/HDL Ratio: 3.2 RATIO
Triglycerides: 76 mg/dL (ref <150)
VLDL: 15 mg/dL (ref 0–40)

## 2014-11-09 LAB — BASIC METABOLIC PANEL
ANION GAP: 11 (ref 5–15)
BUN: 21 mg/dL — ABNORMAL HIGH (ref 6–20)
CALCIUM: 9.1 mg/dL (ref 8.9–10.3)
CHLORIDE: 105 mmol/L (ref 101–111)
CO2: 21 mmol/L — ABNORMAL LOW (ref 22–32)
Creatinine, Ser: 1.22 mg/dL (ref 0.61–1.24)
GFR calc Af Amer: >60 mL/min (ref >60)
GFR calc non Af Amer: >60 mL/min (ref >60)
Glucose, Bld: 98 mg/dL (ref 70–99)
POTASSIUM: 3.9 mmol/L (ref 3.5–5.1)
SODIUM: 137 mmol/L (ref 135–145)

## 2014-11-09 LAB — POCT ACTIVATED CLOTTING TIME
ACTIVATED CLOTTING TIME: 331 s
Activated Clotting Time: 282 seconds

## 2014-11-09 LAB — PROTIME-INR
INR: 1.07 (ref 0.00–1.49)
Prothrombin Time: 14 seconds (ref 11.6–15.2)

## 2014-11-09 LAB — TSH: TSH: 0.724 u[IU]/mL (ref 0.350–4.500)

## 2014-11-09 LAB — HEPARIN LEVEL (UNFRACTIONATED): Heparin Unfractionated: 0.75 IU/mL — ABNORMAL HIGH (ref 0.30–0.70)

## 2014-11-09 SURGERY — LEFT HEART CATH AND CORONARY ANGIOGRAPHY

## 2014-11-09 MED ORDER — ASPIRIN 81 MG PO CHEW
81.0000 mg | CHEWABLE_TABLET | ORAL | Status: AC
  Administered 2014-11-09: 81 mg via ORAL
  Filled 2014-11-09: qty 1

## 2014-11-09 MED ORDER — FENTANYL CITRATE (PF) 100 MCG/2ML IJ SOLN
INTRAMUSCULAR | Status: AC
  Filled 2014-11-09: qty 2

## 2014-11-09 MED ORDER — CLOPIDOGREL BISULFATE 75 MG PO TABS
ORAL_TABLET | ORAL | Status: AC
Start: 2014-11-09 — End: 2014-11-09
  Filled 2014-11-09: qty 1

## 2014-11-09 MED ORDER — HYDRALAZINE HCL 20 MG/ML IJ SOLN
10.0000 mg | Freq: Three times a day (TID) | INTRAMUSCULAR | Status: DC | PRN
  Administered 2014-11-09 – 2014-11-10 (×2): 10 mg via INTRAVENOUS
  Filled 2014-11-09 (×2): qty 1

## 2014-11-09 MED ORDER — FENTANYL CITRATE (PF) 100 MCG/2ML IJ SOLN
INTRAMUSCULAR | Status: DC | PRN
  Administered 2014-11-09: 50 ug via INTRAVENOUS

## 2014-11-09 MED ORDER — CLOPIDOGREL BISULFATE 75 MG PO TABS
ORAL_TABLET | ORAL | Status: DC | PRN
  Administered 2014-11-09: 150 mg via ORAL

## 2014-11-09 MED ORDER — SODIUM CHLORIDE 0.9 % IJ SOLN: 3.0000 mL | INTRAMUSCULAR | Status: DC | PRN

## 2014-11-09 MED ORDER — SODIUM CHLORIDE 0.9 % WEIGHT BASED INFUSION
3.0000 mL/kg/h | INTRAVENOUS | Status: AC
  Administered 2014-11-09: 3 mL/kg/h via INTRAVENOUS

## 2014-11-09 MED ORDER — SODIUM CHLORIDE 0.9 % IJ SOLN: 3.0000 mL | Freq: Two times a day (BID) | INTRAMUSCULAR | Status: DC

## 2014-11-09 MED ORDER — NITROGLYCERIN 1 MG/10 ML FOR IR/CATH LAB
INTRA_ARTERIAL | Status: AC
  Filled 2014-11-09: qty 10

## 2014-11-09 MED ORDER — AMLODIPINE BESYLATE 5 MG PO TABS
5.0000 mg | ORAL_TABLET | Freq: Every day | ORAL | Status: DC
  Administered 2014-11-09: 21:00:00 5 mg via ORAL
  Filled 2014-11-09: qty 1

## 2014-11-09 MED ORDER — SODIUM CHLORIDE 0.9 % WEIGHT BASED INFUSION: 3.0000 mL/kg/h | INTRAVENOUS | Status: DC

## 2014-11-09 MED ORDER — NITROGLYCERIN 0.2 MG/ML ON CALL CATH LAB
INTRAVENOUS | Status: DC | PRN
  Administered 2014-11-09 (×3): 200 ug via INTRAVENOUS

## 2014-11-09 MED ORDER — VERAPAMIL HCL 2.5 MG/ML IV SOLN
INTRAVENOUS | Status: AC
  Filled 2014-11-09: qty 2

## 2014-11-09 MED ORDER — SODIUM CHLORIDE 0.9 % IV SOLN: 250.0000 mL | INTRAVENOUS | Status: DC | PRN

## 2014-11-09 MED ORDER — MIDAZOLAM HCL 2 MG/2ML IJ SOLN
INTRAMUSCULAR | Status: AC
  Filled 2014-11-09: qty 2

## 2014-11-09 MED ORDER — AMLODIPINE BESYLATE 5 MG PO TABS: 5.0000 mg | ORAL_TABLET | Freq: Once | ORAL | Status: DC

## 2014-11-09 MED ORDER — MIDAZOLAM HCL 2 MG/2ML IJ SOLN
INTRAMUSCULAR | Status: DC | PRN
  Administered 2014-11-09: 2 mg via INTRAVENOUS

## 2014-11-09 MED ORDER — HEPARIN SODIUM (PORCINE) 1000 UNIT/ML IJ SOLN
INTRAMUSCULAR | Status: DC | PRN
  Administered 2014-11-09: 4000 [IU] via INTRAVENOUS
  Administered 2014-11-09: 2000 [IU] via INTRAVENOUS
  Administered 2014-11-09: 3500 [IU] via INTRAVENOUS

## 2014-11-09 MED ORDER — SODIUM CHLORIDE 0.9 % WEIGHT BASED INFUSION
1.0000 mL/kg/h | INTRAVENOUS | Status: DC
  Administered 2014-11-09: 1 mL/kg/h via INTRAVENOUS

## 2014-11-09 MED ORDER — HEPARIN (PORCINE) IN NACL 2-0.9 UNIT/ML-% IJ SOLN
INTRAMUSCULAR | Status: AC
  Filled 2014-11-09: qty 1000

## 2014-11-09 MED ORDER — VERAPAMIL HCL 2.5 MG/ML IV SOLN
INTRAVENOUS | Status: DC | PRN
  Administered 2014-11-09: 13:00:00 via INTRA_ARTERIAL

## 2014-11-09 MED ORDER — HEPARIN SODIUM (PORCINE) 1000 UNIT/ML IJ SOLN
INTRAMUSCULAR | Status: AC
  Filled 2014-11-09: qty 1

## 2014-11-09 MED ORDER — CLOPIDOGREL BISULFATE 75 MG PO TABS
ORAL_TABLET | ORAL | Status: AC
  Filled 2014-11-09: qty 1

## 2014-11-09 MED ORDER — IOHEXOL 350 MG/ML SOLN
INTRAVENOUS | Status: DC | PRN
  Administered 2014-11-09: 100 mL via INTRA_ARTERIAL
  Administered 2014-11-09: 50 mL via INTRA_ARTERIAL
  Administered 2014-11-09: 100 mL via INTRA_ARTERIAL

## 2014-11-09 MED ORDER — LIDOCAINE HCL (PF) 1 % IJ SOLN
INTRAMUSCULAR | Status: AC
  Filled 2014-11-09: qty 30

## 2014-11-09 SURGICAL SUPPLY — 23 items
BALLN ANGIOSCULPT RX 2.5X15 (BALLOONS) ×4
BALLN EUPHORA RX 2.0X15 (BALLOONS) ×4
BALLN ~~LOC~~ EUPHORA RX 2.75X12 (BALLOONS) ×4
BALLN ~~LOC~~ EUPHORA RX 2.75X8 (BALLOONS) ×4
BALLOON ANGIOSCULPT RX 2.5X15 (BALLOONS) ×2 IMPLANT
BALLOON EUPHORA RX 2.0X15 (BALLOONS) ×2 IMPLANT
BALLOON ~~LOC~~ EUPHORA RX 2.75X12 (BALLOONS) ×2 IMPLANT
BALLOON ~~LOC~~ EUPHORA RX 2.75X8 (BALLOONS) ×2 IMPLANT
CATH INFINITI 5FR ANG PIGTAIL (CATHETERS) ×4 IMPLANT
CATH OPTITORQUE TIG 4.0 5F (CATHETERS) ×4 IMPLANT
DEVICE RAD COMP TR BAND LRG (VASCULAR PRODUCTS) ×4 IMPLANT
GLIDESHEATH SLEND A-KIT 6F 22G (SHEATH) ×4 IMPLANT
GUIDE CATH RUNWAY 6FR HS (CATHETERS) ×4 IMPLANT
KIT ENCORE 26 ADVANTAGE (KITS) ×4 IMPLANT
KIT HEART LEFT (KITS) ×4 IMPLANT
PACK CARDIAC CATHETERIZATION (CUSTOM PROCEDURE TRAY) ×4 IMPLANT
STENT SYNERGY DES 2.5X38 (Permanent Stent) ×4 IMPLANT
STENT SYNERGY DES 2.75X16 (Permanent Stent) ×4 IMPLANT
SYR MEDRAD MARK V 150ML (SYRINGE) ×4 IMPLANT
TRANSDUCER W/STOPCOCK (MISCELLANEOUS) ×4 IMPLANT
TUBING CIL FLEX 10 FLL-RA (TUBING) ×4 IMPLANT
WIRE HI TORQ BMW 190CM (WIRE) ×4 IMPLANT
WIRE SAFE-T 1.5MM-J .035X260CM (WIRE) ×8 IMPLANT

## 2014-11-09 NOTE — Progress Notes (Signed)
  Subjective:  Denies SSCP, palpitations or Dyspnea   Objective:  Filed Vitals:   11/08/14 1952 11/08/14 2350 11/09/14 0354 11/09/14 0753  BP: 138/75 147/86 144/92 155/84  Pulse: 63 64 56 53  Temp: 99.1 F (37.3 C) 99.1 F (37.3 C) 98.1 F (36.7 C) 98.7 F (37.1 C)  TempSrc: Oral Oral Oral Oral  Resp:    18  Height:      Weight:   174 lb 11.2 oz (79.243 kg)   SpO2: 96% 97% 100% 100%    Intake/Output from previous day:  Intake/Output Summary (Last 24 hours) at 11/09/14 1111 Last data filed at 11/09/14 0947  Gross per 24 hour  Intake    273 ml  Output    500 ml  Net   -227 ml    Physical Exam: Affect appropriate Healthy:  appears stated age HEENT: normal Neck supple with no adenopathy JVP normal no bruits no thyromegaly Lungs clear with no wheezing and good diaphragmatic motion Heart:  S1/S2 no murmur, no rub, gallop or click PMI normal Abdomen: benighn, BS positve, no tenderness, no AAA no bruit.  No HSM or HJR Distal pulses intact with no bruits No edema Neuro non-focal Skin warm and dry No muscular weakness   Lab Results: Basic Metabolic Panel:  Recent Labs  11/08/14 1225 11/09/14 0852  NA 138 137  K 4.5 3.9  CL 105 105  CO2 19* 21*  GLUCOSE 137* 98  BUN 23* 21*  CREATININE 1.11 1.22  CALCIUM 9.3 9.1   Liver Function Tests:  Recent Labs  11/08/14 1225  AST 41  ALT 21  ALKPHOS 87  BILITOT 0.5  PROT 9.4*  ALBUMIN 4.8    Recent Labs  11/08/14 1225  LIPASE 24   CBC:  Recent Labs  11/08/14 1225 11/09/14 0852  WBC 10.2 10.2  NEUTROABS 8.1*  --   HGB 13.2 11.9*  HCT 38.9* 35.1*  MCV 88.0 86.5  PLT 206 197   Cardiac Enzymes:  Recent Labs  11/08/14 2123 11/09/14 0246 11/09/14 0852  TROPONINI 1.69* 1.79* 1.55*   Fasting Lipid Panel:  Recent Labs  11/09/14 0246  CHOL 107  HDL 33*  LDLCALC 59  TRIG 76  CHOLHDL 3.2   Thyroid Function Tests:  Recent Labs  11/08/14 2123  TSH 0.724   Anemia Panel: No  results for input(s): VITAMINB12, FOLATE, FERRITIN, TIBC, IRON, RETICCTPCT in the last 72 hours.  Imaging: Ct Abdomen Pelvis W Contrast  11/08/2014   CLINICAL DATA:  49-year-old male with abdominal pain nausea and vomiting since last night. Current history of lupus.  EXAM: CT ABDOMEN AND PELVIS WITH CONTRAST  TECHNIQUE: Multidetector CT imaging of the abdomen and pelvis was performed using the standard protocol following bolus administration of intravenous contrast.  CONTRAST:  100mL OMNIPAQUE IOHEXOL 300 MG/ML  SOLN  COMPARISON:  [facility] pelvis CT 09/25/2013. Noncontrast CT Abdomen and Pelvis 12/10/2005.  FINDINGS: Occasional cystic changes in the visible left lower lobe appear increased since 2007 (series 5, image 1). Mild dependent atelectasis in the right lower lobe. No pleural or pericardial effusion.  Bilateral hip arthroplasty sequelae. Subsequent streak artifact through the pelvis. No acute osseous abnormality identified.  No pelvic free fluid. The rectum appears decompressed. Grossly negative bladder.  Abnormal appearance of the left inguinal canal is new from prior studies, but not definitely related to an inguinal hernia. Oval intermediate density mass encompassing 18 mm diameter and about 6 cm in length. See series   3, image 81 and compare to series 2, image 41 and 2015. The left inguinal canal was normal in 2007.  Redundant but otherwise negative sigmoid colon. Retained stool throughout much of the colon. Redundant hepatic flexure. Occasional diverticula. No large bowel inflammation. Normal appendix. No dilated small bowel. Negative terminal ileum. Negative stomach and duodenum.  Liver, gallbladder, spleen, pancreas and adrenal glands are within normal limits. Normal portal venous system. Aortoiliac calcified atherosclerosis noted. Major arterial structures are patent. No abdominal free fluid. No lymphadenopathy identified. Bilateral renal enhancement and contrast excretion within  normal limits.  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  IMPRESSION: 1. New since 2015 6 cm long by 1-2 cm thick intermediate soft tissue density associated with the left inguinal ligament. This is not appeared to be related to an inguinal hernia. Recommend followup scrotal ultrasound. 2. Otherwise no acute or inflammatory finding in the abdomen or pelvis.   Electronically Signed   By: H  Hall M.D.   On: 11/08/2014 14:52    Cardiac Studies:  ECG:  SR non specific lateral T wave changes   Telemetry:  NSR no arrhythmia   Echo:   Medications:   . aspirin EC  162 mg Oral Daily  . clobetasol cream  1 application Topical Daily  . clopidogrel  75 mg Oral Daily  . DULoxetine  60 mg Oral Daily  . gabapentin  600 mg Oral TID  . hydrocortisone  1 application Topical Daily  . hydroxychloroquine  200 mg Oral BID  . lisinopril  20 mg Oral Daily  . metoprolol succinate  50 mg Oral Daily  . pravastatin  40 mg Oral q1800  . sodium chloride  3 mL Intravenous Q12H  . tiZANidine  4 mg Oral TID     . [START ON 11/10/2014] sodium chloride     Followed by  . [START ON 11/10/2014] sodium chloride 1 mL/kg/hr (11/09/14 0947)  . heparin 1,150 Units/hr (11/09/14 0800)    Assessment/Plan:  HTN:  Continue ACE well controlled Chol:  On statin goal depending on findings of cath Abdominal Pain:  CT negative some thickening left inguinal ligament area will order scrotal US as suggested Abdomen benign this am Chest Pain: ? From vohmiting  Distant ? RCA stent troponin 1.7 cath today  Mesha Schamberger 11/09/2014, 11:11 AM     

## 2014-11-09 NOTE — Interval H&P Note (Signed)
History and Physical Interval Note:  11/09/2014 12:29 PM  Christopher KlippelCalvin Nunez  has presented today for surgery, with the diagnosis of NSTEMI. The various methods of treatment have been discussed with the patient and family. After consideration of risks, benefits and other options for treatment, the patient has consented to  Procedure(s): Left Heart Cath and Coronary Angiography (N/A) with Possible Percutaneous Coronary Intervention   as a surgical intervention .  The patient's history has been reviewed, patient examined, no change in status, stable for surgery.  I have reviewed the patient's chart and labs.  Questions were answered to the patient's satisfaction.     Cath Lab Visit (complete for each Cath Lab visit)  Clinical Evaluation Leading to the Procedure:   ACS: Yes.    Non-ACS:    Anginal Classification: CCS IV  Anti-ischemic medical therapy: Maximal Therapy (2 or more classes of medications)  Non-Invasive Test Results: No non-invasive testing performed  Prior CABG: No previous CABG Christopher Nunez

## 2014-11-09 NOTE — Plan of Care (Signed)
Problem: Phase I Progression Outcomes Goal: Other Phase I Outcomes/Goals Outcome: Completed/Met Date Met:  11/09/14 Patient and wife educated about Cardiac Cath and possibility of intervention. Consent signed by wife as patient did not feel he had the strength.

## 2014-11-09 NOTE — Progress Notes (Signed)
UR COMPLETED  

## 2014-11-09 NOTE — Progress Notes (Signed)
Nurse called and reported constat high BP 150s-180s/80s-100s despite IV hydralazine 10mg  @1600 . His BP was normal in AM. Could not able to titrate up BB due low HRs as well as ACE due to recent cath. Started amlodipine 5mg  qd - including dose now, can titrate up as needed or can give hydralazine more frequently.

## 2014-11-09 NOTE — Progress Notes (Signed)
TR BAND REMOVAL  LOCATION:  right radial  DEFLATED PER PROTOCOL:  Yes.    TIME BAND OFF / DRESSING APPLIED:   1845   SITE UPON ARRIVAL:   Level 0  SITE AFTER BAND REMOVAL:  Level 0  REVERSE ALLEN'S TEST:    positive  CIRCULATION SENSATION AND MOVEMENT:  Within Normal Limits  Yes.    COMMENTS:    

## 2014-11-09 NOTE — H&P (View-Only) (Signed)
Subjective:  Denies SSCP, palpitations or Dyspnea   Objective:  Filed Vitals:   11/08/14 1952 11/08/14 2350 11/09/14 0354 11/09/14 0753  BP: 138/75 147/86 144/92 155/84  Pulse: 63 64 56 53  Temp: 99.1 F (37.3 C) 99.1 F (37.3 C) 98.1 F (36.7 C) 98.7 F (37.1 C)  TempSrc: Oral Oral Oral Oral  Resp:    18  Height:      Weight:   174 lb 11.2 oz (79.243 kg)   SpO2: 96% 97% 100% 100%    Intake/Output from previous day:  Intake/Output Summary (Last 24 hours) at 11/09/14 1111 Last data filed at 11/09/14 0947  Gross per 24 hour  Intake    273 ml  Output    500 ml  Net   -227 ml    Physical Exam: Affect appropriate Healthy:  appears stated age HEENT: normal Neck supple with no adenopathy JVP normal no bruits no thyromegaly Lungs clear with no wheezing and good diaphragmatic motion Heart:  S1/S2 no murmur, no rub, gallop or click PMI normal Abdomen: benighn, BS positve, no tenderness, no AAA no bruit.  No HSM or HJR Distal pulses intact with no bruits No edema Neuro non-focal Skin warm and dry No muscular weakness   Lab Results: Basic Metabolic Panel:  Recent Labs  16/04/9604/02/16 1225 11/09/14 0852  NA 138 137  K 4.5 3.9  CL 105 105  CO2 19* 21*  GLUCOSE 137* 98  BUN 23* 21*  CREATININE 1.11 1.22  CALCIUM 9.3 9.1   Liver Function Tests:  Recent Labs  11/08/14 1225  AST 41  ALT 21  ALKPHOS 87  BILITOT 0.5  PROT 9.4*  ALBUMIN 4.8    Recent Labs  11/08/14 1225  LIPASE 24   CBC:  Recent Labs  11/08/14 1225 11/09/14 0852  WBC 10.2 10.2  NEUTROABS 8.1*  --   HGB 13.2 11.9*  HCT 38.9* 35.1*  MCV 88.0 86.5  PLT 206 197   Cardiac Enzymes:  Recent Labs  11/08/14 2123 11/09/14 0246 11/09/14 0852  TROPONINI 1.69* 1.79* 1.55*   Fasting Lipid Panel:  Recent Labs  11/09/14 0246  CHOL 107  HDL 33*  LDLCALC 59  TRIG 76  CHOLHDL 3.2   Thyroid Function Tests:  Recent Labs  11/08/14 2123  TSH 0.724   Anemia Panel: No  results for input(s): VITAMINB12, FOLATE, FERRITIN, TIBC, IRON, RETICCTPCT in the last 72 hours.  Imaging: Ct Abdomen Pelvis W Contrast  11/08/2014   CLINICAL DATA:  49 year old male with abdominal pain nausea and vomiting since last night. Current history of lupus.  EXAM: CT ABDOMEN AND PELVIS WITH CONTRAST  TECHNIQUE: Multidetector CT imaging of the abdomen and pelvis was performed using the standard protocol following bolus administration of intravenous contrast.  CONTRAST:  100mL OMNIPAQUE IOHEXOL 300 MG/ML  SOLN  COMPARISON:  [facility] CT 09/25/2013. Noncontrast CT Abdomen and Pelvis 12/10/2005.  FINDINGS: Occasional cystic changes in the visible left lower lobe appear increased since 2007 (series 5, image 1). Mild dependent atelectasis in the right lower lobe. No pleural or pericardial effusion.  Bilateral hip arthroplasty sequelae. Subsequent streak artifact through the pelvis. No acute osseous abnormality identified.  No pelvic free fluid. The rectum appears decompressed. Grossly negative bladder.  Abnormal appearance of the left inguinal canal is new from prior studies, but not definitely related to an inguinal hernia. Oval intermediate density mass encompassing 18 mm diameter and about 6 cm in length. See series  3, image 81 and compare to series 2, image 41 and 2015. The left inguinal canal was normal in 2007.  Redundant but otherwise negative sigmoid colon. Retained stool throughout much of the colon. Redundant hepatic flexure. Occasional diverticula. No large bowel inflammation. Normal appendix. No dilated small bowel. Negative terminal ileum. Negative stomach and duodenum.  Liver, gallbladder, spleen, pancreas and adrenal glands are within normal limits. Normal portal venous system. Aortoiliac calcified atherosclerosis noted. Major arterial structures are patent. No abdominal free fluid. No lymphadenopathy identified. Bilateral renal enhancement and contrast excretion within  normal limits.  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  IMPRESSION: 1. New since 2015 6 cm long by 1-2 cm thick intermediate soft tissue density associated with the left inguinal ligament. This is not appeared to be related to an inguinal hernia. Recommend followup scrotal ultrasound. 2. Otherwise no acute or inflammatory finding in the abdomen or pelvis.   Electronically Signed   By: Odessa Fleming M.D.   On: 11/08/2014 14:52    Cardiac Studies:  ECG:  SR non specific lateral T wave changes   Telemetry:  NSR no arrhythmia   Echo:   Medications:   . aspirin EC  162 mg Oral Daily  . clobetasol cream  1 application Topical Daily  . clopidogrel  75 mg Oral Daily  . DULoxetine  60 mg Oral Daily  . gabapentin  600 mg Oral TID  . hydrocortisone  1 application Topical Daily  . hydroxychloroquine  200 mg Oral BID  . lisinopril  20 mg Oral Daily  . metoprolol succinate  50 mg Oral Daily  . pravastatin  40 mg Oral q1800  . sodium chloride  3 mL Intravenous Q12H  . tiZANidine  4 mg Oral TID     . [START ON 11/10/2014] sodium chloride     Followed by  . [START ON 11/10/2014] sodium chloride 1 mL/kg/hr (11/09/14 0947)  . heparin 1,150 Units/hr (11/09/14 0800)    Assessment/Plan:  HTN:  Continue ACE well controlled Chol:  On statin goal depending on findings of cath Abdominal Pain:  CT negative some thickening left inguinal ligament area will order scrotal US as suggested Abdomen benign this am Chest Pain: ? From vohmiting  Distant ? RCA stent troponin 1.7 cath today  Charlton Haws 11/09/2014, 11:11 AM

## 2014-11-10 ENCOUNTER — Inpatient Hospital Stay (HOSPITAL_COMMUNITY): Payer: Medicare Other

## 2014-11-10 ENCOUNTER — Encounter (HOSPITAL_COMMUNITY): Payer: Self-pay | Admitting: Cardiology

## 2014-11-10 LAB — BASIC METABOLIC PANEL
Anion gap: 12 (ref 5–15)
BUN: 12 mg/dL (ref 6–20)
CHLORIDE: 104 mmol/L (ref 101–111)
CO2: 21 mmol/L — ABNORMAL LOW (ref 22–32)
Calcium: 8.9 mg/dL (ref 8.9–10.3)
Creatinine, Ser: 1 mg/dL (ref 0.61–1.24)
GFR calc non Af Amer: >60 mL/min (ref >60)
Glucose, Bld: 74 mg/dL (ref 70–99)
Potassium: 3.3 mmol/L — ABNORMAL LOW (ref 3.5–5.1)
Sodium: 137 mmol/L (ref 135–145)

## 2014-11-10 LAB — CBC
HCT: 36.7 % — ABNORMAL LOW (ref 39.0–52.0)
HEMOGLOBIN: 12.2 g/dL — AB (ref 13.0–17.0)
MCH: 28.8 pg (ref 26.0–34.0)
MCHC: 33.2 g/dL (ref 30.0–36.0)
MCV: 86.8 fL (ref 78.0–100.0)
Platelets: 183 10*3/uL (ref 150–400)
RBC: 4.23 MIL/uL (ref 4.22–5.81)
RDW: 14.6 % (ref 11.5–15.5)
WBC: 7.6 10*3/uL (ref 4.0–10.5)

## 2014-11-10 MED ORDER — OXYCODONE HCL 5 MG PO TABS: 5.0000 mg | ORAL_TABLET | ORAL | Status: DC | PRN

## 2014-11-10 MED ORDER — ZOLPIDEM TARTRATE 5 MG PO TABS: 5.0000 mg | ORAL_TABLET | Freq: Every evening | ORAL | Status: DC | PRN

## 2014-11-10 MED ORDER — ONDANSETRON HCL 4 MG/2ML IJ SOLN
4.0000 mg | Freq: Four times a day (QID) | INTRAMUSCULAR | Status: DC | PRN
Start: 2014-11-10 — End: 2014-11-10

## 2014-11-10 MED ORDER — PRAMOXINE-ZINC OXIDE IN MO 1-12.5 % RE OINT
1.0000 "application " | TOPICAL_OINTMENT | Freq: Three times a day (TID) | RECTAL | Status: DC | PRN
  Filled 2014-11-10: qty 28.3

## 2014-11-10 MED ORDER — ASPIRIN EC 81 MG PO TBEC
162.0000 mg | DELAYED_RELEASE_TABLET | Freq: Every day | ORAL | Status: DC
  Administered 2014-11-10: 10:00:00 162 mg via ORAL

## 2014-11-10 MED ORDER — NITROGLYCERIN 0.4 MG SL SUBL: 0.4000 mg | SUBLINGUAL_TABLET | SUBLINGUAL | Status: DC | PRN

## 2014-11-10 MED ORDER — GUAIFENESIN-DM 100-10 MG/5ML PO SYRP: 15.0000 mL | ORAL_SOLUTION | ORAL | Status: DC | PRN

## 2014-11-10 MED ORDER — LISINOPRIL 10 MG PO TABS
20.0000 mg | ORAL_TABLET | Freq: Every day | ORAL | Status: DC
  Administered 2014-11-10: 20 mg via ORAL

## 2014-11-10 MED ORDER — CLOPIDOGREL BISULFATE 75 MG PO TABS
75.0000 mg | ORAL_TABLET | Freq: Every day | ORAL | Status: DC
  Administered 2014-11-10: 75 mg via ORAL

## 2014-11-10 MED ORDER — MAGNESIUM HYDROXIDE 400 MG/5ML PO SUSP: 30.0000 mL | Freq: Every day | ORAL | Status: DC | PRN

## 2014-11-10 MED ORDER — HYDROCORTISONE 1 % EX CREA
1.0000 "application " | TOPICAL_CREAM | Freq: Three times a day (TID) | CUTANEOUS | Status: DC | PRN
  Filled 2014-11-10: qty 28

## 2014-11-10 MED ORDER — AMLODIPINE BESYLATE 10 MG PO TABS: 10.0000 mg | ORAL_TABLET | Freq: Every day | ORAL | Status: AC

## 2014-11-10 MED ORDER — LOPERAMIDE HCL 2 MG PO CAPS: 2.0000 mg | ORAL_CAPSULE | ORAL | Status: DC | PRN

## 2014-11-10 MED ORDER — PRAVASTATIN SODIUM 40 MG PO TABS: 40.0000 mg | ORAL_TABLET | Freq: Every day | ORAL | Status: DC

## 2014-11-10 MED ORDER — TIZANIDINE HCL 4 MG PO TABS
4.0000 mg | ORAL_TABLET | Freq: Three times a day (TID) | ORAL | Status: DC
  Administered 2014-11-10: 4 mg via ORAL
  Filled 2014-11-10 (×3): qty 1

## 2014-11-10 MED ORDER — POTASSIUM CHLORIDE CRYS ER 20 MEQ PO TBCR
40.0000 meq | EXTENDED_RELEASE_TABLET | Freq: Once | ORAL | Status: AC
  Administered 2014-11-10: 40 meq via ORAL
  Filled 2014-11-10: qty 2

## 2014-11-10 MED ORDER — ALUM & MAG HYDROXIDE-SIMETH 200-200-20 MG/5ML PO SUSP: 30.0000 mL | ORAL | Status: DC | PRN

## 2014-11-10 MED ORDER — HYDROXYCHLOROQUINE SULFATE 200 MG PO TABS
200.0000 mg | ORAL_TABLET | Freq: Two times a day (BID) | ORAL | Status: DC
  Administered 2014-11-10: 200 mg via ORAL
  Filled 2014-11-10 (×2): qty 1

## 2014-11-10 MED ORDER — ACETAMINOPHEN 325 MG PO TABS: 650.0000 mg | ORAL_TABLET | Freq: Four times a day (QID) | ORAL | Status: DC | PRN

## 2014-11-10 MED ORDER — HYDROCORTISONE 1 % EX CREA
TOPICAL_CREAM | Freq: Two times a day (BID) | CUTANEOUS | Status: DC
  Filled 2014-11-10: qty 28

## 2014-11-10 MED ORDER — AMLODIPINE BESYLATE 10 MG PO TABS
10.0000 mg | ORAL_TABLET | Freq: Every day | ORAL | Status: DC
  Administered 2014-11-10: 11:00:00 10 mg via ORAL
  Filled 2014-11-10: qty 1

## 2014-11-10 MED ORDER — METOPROLOL SUCCINATE ER 50 MG PO TB24
50.0000 mg | ORAL_TABLET | Freq: Every day | ORAL | Status: DC
  Administered 2014-11-10: 50 mg via ORAL

## 2014-11-10 MED ORDER — CLOBETASOL PROPIONATE 0.05 % EX CREA
TOPICAL_CREAM | Freq: Two times a day (BID) | CUTANEOUS | Status: DC
  Filled 2014-11-10: qty 15

## 2014-11-10 MED ORDER — ALPRAZOLAM 0.25 MG PO TABS: 0.2500 mg | ORAL_TABLET | Freq: Two times a day (BID) | ORAL | Status: DC | PRN

## 2014-11-10 MED ORDER — DULOXETINE HCL 60 MG PO CPEP
60.0000 mg | ORAL_CAPSULE | Freq: Every day | ORAL | Status: DC
  Administered 2014-11-10: 11:00:00 60 mg via ORAL
  Filled 2014-11-10: qty 1

## 2014-11-10 MED ORDER — GABAPENTIN 600 MG PO TABS
600.0000 mg | ORAL_TABLET | Freq: Three times a day (TID) | ORAL | Status: DC
  Administered 2014-11-10: 10:00:00 600 mg via ORAL
  Filled 2014-11-10 (×3): qty 1

## 2014-11-10 NOTE — Discharge Instructions (Signed)
Your May 6th appointment at Dr. Georgiann CockerMcGukin's office was cancelled.

## 2014-11-10 NOTE — Progress Notes (Addendum)
Reviewed information with patient regarding NSTEMI, post MI healing, medications and how they work together to support heart health and healing. Discussed abstaining from sexual activity until discussed with provider in follow up visit. Patient verbalized good understanding of information. Denies further questions at this time.  1150 Discussed smoking cessation and using a plastic cigarette to help with quitting and replace the oral and tactile stimuli. Reviewed risks of PAD caused by smoking with patient with good verbalized understanding. Patient agreed to decrease and stop smoking. Will use the plastic cigarette and nicotine replacement.

## 2014-11-10 NOTE — Discharge Summary (Signed)
Physician Discharge Summary     Cardiologist:  McGukin PCP: Wallene Dales, MD  Patient ID: Christopher Nunez MRN: [id number] DOB/AGE: 48/21/1967 48 y.o.  Admit date: 11/08/2014 Discharge date: 11/10/2014  Admission Diagnoses:  NSTEMI   Discharge Diagnoses:  Principal Problem:   NSTEMI (non-ST elevated myocardial infarction) Active Problems:   Hyperlipidemia with target LDL less than 70   Essential hypertension   Lupus (systemic lupus erythematosus)   CAD S/P percutaneous coronary angioplasty   Stenosis of coronary stent   Discharged Condition: stable  Hospital Course:   Christopher Nunez is a 49 y.o. male with a history of CAD. The night before admission he developed chills, diaphoresis, N&V. The symptoms lasted all night long. Seen by EMS in the night, but not transported. CBG may have been low.  No diarrhea.   Pt had noticed a burning sensation with exertion for about a week, total of 2 episodes. He rested and used SL NTG spray with relief of symptoms. Previous cardiac symptoms was throbbing, heart beating fast.   Pt developed pain in the lower part of his abdomen from all the vomiting but did not have any chest discomfort.   When the diaphoresis, N&V did not resolve, a friend took he and his wife to [facility]. His cardiac enzymes were elevated and trending up, so he was transferred to [facility]. He has not eaten today. The last time he vomited was just before he left [facility].   The patient was admitted and continued on ASA, heparin, BB , plavix.  SP left heart cath revealing:   Severe single-vessel CAD with 3 site in-stent restenosis in the mid RCA - 70% ostial LAD, 80% mid and 90% at the distal edge  Acute Mrg lesion, 100% stenosed. Jailed by previously placed stent  2 overlapping drug-eluting stents covering all of the notable disease (Synergy DES 2.5 mm x 38 mm left proximally with a 2.75 mm x 16 mm) - postdilated to 2.9 mm distal and 3.2 mm proximal  Preserved LV function with normal LVEDP.  Mild inferobasilar hypokinesis  Prox RCA to Mid RCA lesion, 90% stenosed. There is a 0% residual stenosis post intervention. A drug-eluting stent was placed.  ASA and Plavix life-long. Amlodipine added and increased to 40m for better BP control.  May need OP titration or additional BP meds.  Scrotal [place] to look at 6 cm long by 1-2 cm thick intermediate soft tissue density associated with the left inguinal ligament found on CT. We replaced potassium.  He ambulated with cardiac rehab [id number] morning before discharge and was noted to have significant abdominal pain during education portion with cardiac rehab.  TSH WNL.  CT abd/pel revealed a new, since 2015, 6 cm long by 1-2 cm thick intermediate soft tissue density associated with the left inguinal ligament. This is not appeared to be related to an inguinal hernia.  [place] the scrotum/left inguinal region revealed a lymph node is seen in the left inguinal region measuring 1.1 x 1.0 x 0.5 cm. This is not pathologically enlarged based on short axis diameter of 5 mm. This could be followed clinically.  Scattered microcalcifications.  Self testicular exams discussed.  FU with PCP.  The patient was seen by Dr. NiJohnsie Cancelho felt he was stable for DC home.  Followup at Dr. McVaughan Brownerffice arranged.     Consults:  Cardiac Rehab  Significant Diagnostic Studies:    Cardiac Cath Mr. KePuttresented with a non-ST elevation MI. He had a recent PCI for  in-stent restenosis of the stent placed in 2007 (unknown type likely DES). The stent placed in December 2015 was a resolute DES 2.5 mm x 18 mm in the RCA.  She presents to the cardiac catheter sedation lab in the fasting state.  Time Out: Verified patient identification, verified procedure, site/side was marked, verified correct patient position, special equipment/implants available, medications/allergies/relevent history reviewed, required imaging and test results available. Performed. Consent  Signed.There were no immediate complications during the procedure. CARDIAC CATHETERIZATION PROCEDURE  Access:  RIGHT Radial Artery: 6 Fr sheath -- Seldinger technique using Angiocath Micropuncture Kit 10 mL radial cocktail IA; 4000 Units IV Heparin  Left Heart Catheterization:  5 Fr Catheters advanced or exchanged over a J-wire under direct fluoroscopic guidance into the ascending aorta; TIG 4.0 catheter advanced first.  Left Coronary Artery Cineangiography: TIG 4.0 Catheter  Right Coronary Artery Cineangiography: TIG 4.0 Catheter  LV Hemodynamics (LV Gram): Angled pigtail catheter  Radial Sheath(s) removed in the cardiac Cath Lab with TR band placement for hemostasis.   TR Band: 1410 Hours; 11 mL air   Severe single-vessel CAD with 3 site in-stent restenosis in the mid RCA - 70% ostial LAD, 80% mid and 90% at the distal edge  Acute Mrg lesion, 100% stenosed. Jailed by previously placed stent  2 overlapping drug-eluting stents covering all of the notable disease (Synergy DES 2.5 mm x 38 mm left proximally with a 2.75 mm x 16 mm) - postdilated to 2.9 mm distal and 3.2 mm proximal  Preserved LV function with normal LVEDP. Mild inferobasilar hypokinesis  Prox RCA to Mid RCA lesion, 90% stenosed. There is a 0% residual stenosis post intervention. A drug-eluting stent was placed.  Admitted with STEMI with a history of RCA PCI all by instent restenosis PCI December 2015. Found to again have in-stent restenosis of the RCA stents. This was now treated with 2 overlapping Synergy DES stents. One focal spot of incomplete apposition roughly 10-20% stenosis.  Sessile PCI with TIMI 3 flow distally.  Plan: Standard post radial cath PCI care with TR band removal. Continue dual antiplatelet therapy, essentially lifelong. Aggressive risk factor modification including blood pressure, lipid and symmetric control. Smoking cessation counseling required.   ULTRASOUND OF  SCROTUM  TECHNIQUE: Complete ultrasound examination of the testicles, epididymis, and other scrotal structures was performed.  COMPARISON: None available for comparison.  FINDINGS: Right testicle  Measurements: 3.8 x 2.1 x 2.7 cm. Scattered microcalcifications. No focal mass.  Left testicle  Measurements: 3.7 x 1.8 x 2.5 cm. Scattered microcalcifications. No focal mass.  Right epididymis: Normal in size and appearance.  Left epididymis: Normal in size and appearance.  Hydrocele: None visualized.  Varicocele: None visualized.  Other lymph node is seen in the left inguinal region measuring 1.1 x 1.0 x 0.5 cm. This is not pathologically enlarged based on short axis diameter of 5 mm. This could be followed clinically.  IMPRESSION: Scattered microcalcifications. 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 Monthly testicular self-examination and annual physical exams are considered appropriate surveillance. If patient has other risk factors for testicular carcinoma, then referral to Urology should be considered. (Reference: DeCastro, et al.: A 5-Year Follow up Study of Asymptomatic Men with Testicular Microlithiasis. J Urol 2008; 142:3953-2023.)  Small left inguinal lymph node, not pathologically enlarged based on imaging criteria and short axis diameter.   Electronically  Signed  By: Rolm Baptise M.D.  CT ABDOMEN AND PELVIS WITH CONTRAST  TECHNIQUE: Multidetector CT imaging of the abdomen and pelvis was performed using the standard protocol following bolus administration of intravenous contrast.  CONTRAST: 124m OMNIPAQUE IOHEXOL 300 MG/ML SOLN  [facility] Noncontrast CT Abdomen and Pelvis  12/10/2005.  FINDINGS: Occasional cystic changes in the visible left lower lobe appear increased since 2007 (series 5, image 1). Mild dependent atelectasis in the right lower lobe. No pleural or pericardial effusion.  Bilateral hip arthroplasty sequelae. Subsequent streak artifact through the pelvis. No acute osseous abnormality identified.  No pelvic free fluid. The rectum appears decompressed. Grossly negative bladder.  Abnormal appearance of the left inguinal canal is new from prior studies, but not definitely related to an inguinal hernia. Oval intermediate density mass encompassing 18 mm diameter and about 6 cm in length. See series 3, image 81 and compare to series 2, image 41 and 2015. The left inguinal canal was normal in 2007.  Redundant but otherwise negative sigmoid colon. Retained stool throughout much of the colon. Redundant hepatic flexure. Occasional diverticula. No large bowel inflammation. Normal appendix. No dilated small bowel. Negative terminal ileum. Negative stomach and duodenum.  Liver, gallbladder, spleen, pancreas and adrenal glands are within normal limits. Normal portal venous system. Aortoiliac calcified atherosclerosis noted. Major arterial structures are patent. No abdominal free fluid. No lymphadenopathy identified. Bilateral renal enhancement and contrast excretion within normal limits.  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  IMPRESSION: 1. New since 2015 6 cm long by 1-2 cm thick intermediate soft tissue density associated with the left inguinal ligament. This is not appeared to be related to an inguinal hernia. Recommend followup scrotal ultrasound. 2. Otherwise no acute or inflammatory finding in the abdomen or pelvis.  Treatments: See above  Discharge Exam: Blood pressure 158/100, pulse 78, temperature 98.2 F (36.8 C), temperature source Oral, resp. rate 18, height 6'  1" (1.854 m), weight 175 lb 4.3 oz (79.5 kg), SpO2 100 %.   Disposition: Final discharge disposition not confirmed      Discharge Instructions    Amb Referral to Cardiac Rehabilitation    Complete by:  As directed   Congestive Heart Failure:   If diagnosis is Heart Failure, patient MUST meet each of the CMS criteria:   1. Left Ventricular Ejection Fraction </= 35%   2. NYHA class II-IV symptoms despite being on optimal heart failure therapy for at least 6 weeks.   3. Stable = have not had a recent (<6 weeks) or planned (<6 months) major cardiovascular hospitalization or procedure     Program Details:   - Physician supervised classes   - 1-3 classes per week over a 12-18 week period, generally for a total of 36 sessions     Physician Certification:   I certify that the above Cardiac Rehabilitation treatment is medically necessary and is medically approved by me for treatment of this patient. The patient is willing and cooperative, able to ambulate and medically stable to  participate in exercise rehabilitation. The participant's progress and Individualized Treatment Plan will be reviewed by the Medical Director, Cardiac Rehab staff and as indicated by the Referring/Ordering Physician.  Diagnosis:   Myocardial Infarction Comment - to [place] PCI       Diet - low sodium heart healthy    Complete by:  As directed  Discharge instructions    Complete by:  As directed   No lifting with right arm for three days.     Increase activity slowly    Complete by:  As directed             Medication List    TAKE these medications        amLODipine 10 MG tablet  Commonly known as:  NORVASC  Take 1 tablet (10 mg total) by mouth daily.     aspirin EC 81 MG tablet  Take 162 mg by mouth daily.     clobetasol cream 0.05 %  Commonly known as:  TEMOVATE  Apply 1 application topically daily. Apply to head for rash from lupus     clopidogrel 75 MG tablet   Commonly known as:  PLAVIX  Take 75 mg by mouth daily.     DULoxetine 60 MG capsule  Commonly known as:  CYMBALTA  Take 60 mg by mouth daily.     gabapentin 600 MG tablet  Commonly known as:  NEURONTIN  Take 600 mg by mouth 3 (three) times daily.     hydrocortisone 2.5 % ointment  Apply 1 application topically daily. Apply a thin layer to face for rash from lupus     hydroxychloroquine 200 MG tablet  Commonly known as:  PLAQUENIL  Take 200 mg by mouth 2 (two) times daily.     lisinopril 20 MG tablet  Commonly known as:  PRINIVIL,ZESTRIL  Take 20 mg by mouth daily.     metoprolol succinate 50 MG 24 hr tablet  Commonly known as:  TOPROL-XL  Take 50 mg by mouth daily. Take with or immediately following a meal.     nitroGLYCERIN 0.4 MG/SPRAY spray  Commonly known as:  NITROLINGUAL  Place 1 spray under the tongue every 5 (five) minutes x 3 doses as needed for chest pain.     oxycodone 5 MG capsule  Commonly known as:  OXY-IR  Take 5 mg by mouth 3 (three) times daily. scheduled     pravastatin 40 MG tablet  Commonly known as:  PRAVACHOL  Take 40 mg by mouth daily.     tiZANidine 4 MG tablet  Commonly known as:  ZANAFLEX  Take 4 mg by mouth 3 (three) times daily.       Follow-up Information    Follow up with [facility], MD On 11/22/2014.   Specialty:  Cardiology   Why:  9:30 PM   Contact information:   [address] [place] [address] [phone]      Greater than 30 minutes was spent completing the patient's discharge.    SignedTarri Fuller, PAC 11/10/2014, 11:39 AM

## 2014-11-10 NOTE — Progress Notes (Signed)
Subjective: Occasional ABD pain(suprapubic).  No dysuria  Objective: Vital signs in last 24 hours: Temp:  [98 F (36.7 C)-98.7 F (37.1 C)] 98.4 F (36.9 C) (05/04 0437) Pulse Rate:  [0-76] 76 (05/04 0437) Resp:  [0-59] 19 (05/04 0437) BP: (108-183)/(79-124) 169/89 mmHg (05/04 0437) SpO2:  [0 %-100 %] 99 % (05/04 0437) Weight:  [175 lb 4.3 oz (79.5 kg)] 175 lb 4.3 oz (79.5 kg) (05/04 0437) Last BM Date: 11/07/14  Intake/Output from previous day: 05/03 0701 - 05/04 0700 In: 1681.9 [P.O.:720; I.V.:961.9] Out: 1100 [Urine:1100] Intake/Output this shift:    Medications Scheduled Meds: . amLODipine  5 mg Oral Daily  . aspirin EC  162 mg Oral Daily  . clobetasol cream  1 application Topical Daily  . clopidogrel  75 mg Oral Daily  . DULoxetine  60 mg Oral Daily  . gabapentin  600 mg Oral TID  . hydrocortisone  1 application Topical Daily  . hydroxychloroquine  200 mg Oral BID  . lisinopril  20 mg Oral Daily  . metoprolol succinate  50 mg Oral Daily  . pravastatin  40 mg Oral q1800  . sodium chloride  3 mL Intravenous Q12H  . tiZANidine  4 mg Oral TID   Continuous Infusions:  PRN Meds:.sodium chloride, acetaminophen, ALPRAZolam, hydrALAZINE, nitroGLYCERIN, ondansetron (ZOFRAN) IV, oxyCODONE, sodium chloride, zolpidem  PE: General appearance: alert, cooperative and no distress Lungs: clear to auscultation bilaterally Heart: regular rate and rhythm, S1, S2 normal, no murmur, click, rub or gallop Abdomen: +BS, soft, nontender Extremities: No LEE Pulses: 2+ and symmetric Skin: Warm, sweaty Neurologic: Grossly normal  Lab Results:   Recent Labs  11/08/14 1225 11/09/14 0852 11/10/14 0342  WBC 10.2 10.2 7.6  HGB 13.2 11.9* 12.2*  HCT 38.9* 35.1* 36.7*  PLT 206 197 183   BMET  Recent Labs  11/08/14 1225 11/09/14 0852  NA 138 137  K 4.5 3.9  CL 105 105  CO2 19* 21*  GLUCOSE 137* 98  BUN 23* 21*  CREATININE 1.11 1.22  CALCIUM 9.3 9.1    PT/INR  Recent Labs  11/09/14 0852  LABPROT 14.0  INR 1.07   Cholesterol  Recent Labs  11/09/14 0246  CHOL 107   Lipid Panel     Component Value Date/Time   CHOL 107 11/09/2014 0246   TRIG 76 11/09/2014 0246   HDL 33* 11/09/2014 0246   CHOLHDL 3.2 11/09/2014 0246   VLDL 15 11/09/2014 0246   LDLCALC 59 11/09/2014 0246    Cardiac Panel (last 3 results)  Recent Labs  11/08/14 2123 11/09/14 0246 11/09/14 0852  TROPONINI 1.69* 1.79* 1.55*      Assessment/Plan   Principal Problem:   NSTEMI (non-ST elevated myocardial infarction) Active Problems:   Hyperlipidemia with target LDL less than 70   Essential hypertension   Lupus (systemic lupus erythematosus)   CAD S/P percutaneous coronary angioplasty   Stenosis of coronary stent  Tobacco abuse   Hypokalemia  SP left heart cath revealing:   Severe single-vessel CAD with 3 site in-stent restenosis in the mid RCA - 70% ostial LAD, 80% mid and 90% at the distal edge  Acute Mrg lesion, 100% stenosed. Jailed by previously placed stent  2 overlapping drug-eluting stents covering all of the notable disease (Synergy DES 2.5 mm x 38 mm left proximally with a 2.75 mm x 16 mm) - postdilated to 2.9 mm distal and 3.2 mm proximal  Preserved LV function with normal LVEDP. Mild inferobasilar hypokinesis  Prox RCA to Mid RCA lesion, 90% stenosed. There is a 0% residual stenosis post intervention. A drug-eluting stent was placed.  ASA and Plavix life-long.  Amlodipine added last night.  Will increase to 10mg -give now.   Scrotal US ordered to look at 6 cm long by 1-2 cm thick intermediate soft tissue density associated with the left inguinal ligament found on CT.   Replace K.  Ambulate this morning with CR and DC home later after US.  FU in 2 weeks.       LOS: 2 days    HAGER, BRYAN PA-C 11/10/2014 7:42 AM  Agree with increase in amlodipine  Can d/c after scrotal US  DAT  Right radial A no hematoma plus 3  Charlton HawsPeter  Marji Kuehnel

## 2014-11-10 NOTE — Progress Notes (Signed)
CARDIAC REHAB PHASE I   PRE:  Rate/Rhythm: 92 SR    BP: sitting 158/100    SaO2:   MODE:  Ambulation: 600 ft   POST:  Rate/Rhythm: 96 SR with PACs    BP: sitting 168/97     SaO2:   Pt steady and denied chest tightness. Did c/o diaphoresis after walking at rest. HR fluctuating. Pt also c/o significant left lower abdominal pain 1/2 way through education, HR spiked to 115 ST temporarily. Pt also having periodic PACs during education. Pt attempted to listen to education but generally seemed distracted and anxious. Wife attentive. Discussed MI, stent, importance of Plavix, NTG. Pt not interested in smoking cessation information or diet but received education on ex and CRPII. [facility].  [id number]   Christopher MassonReeve, [name] CES, ACSM 11/10/2014 9:15 AM

## 2020-05-18 ENCOUNTER — Encounter (HOSPITAL_BASED_OUTPATIENT_CLINIC_OR_DEPARTMENT_OTHER): Payer: Self-pay | Admitting: *Deleted

## 2020-05-18 ENCOUNTER — Emergency Department (HOSPITAL_BASED_OUTPATIENT_CLINIC_OR_DEPARTMENT_OTHER): Payer: Medicare Other

## 2020-05-18 ENCOUNTER — Emergency Department (HOSPITAL_BASED_OUTPATIENT_CLINIC_OR_DEPARTMENT_OTHER)
Admission: EM | Admit: 2020-05-18 | Discharge: 2020-05-18 | Disposition: A | Discharge status: Home / Self Care | Payer: Medicare Other | Attending: Emergency Medicine | Admitting: Emergency Medicine

## 2020-05-18 ENCOUNTER — Other Ambulatory Visit: Payer: Self-pay

## 2020-05-18 DIAGNOSIS — R0602 Shortness of breath: Secondary | ICD-10-CM | POA: Insufficient documentation

## 2020-05-18 DIAGNOSIS — I10 Essential (primary) hypertension: Secondary | ICD-10-CM | POA: Insufficient documentation

## 2020-05-18 DIAGNOSIS — R0789 Other chest pain: Secondary | ICD-10-CM | POA: Insufficient documentation

## 2020-05-18 DIAGNOSIS — Z955 Presence of coronary angioplasty implant and graft: Secondary | ICD-10-CM | POA: Diagnosis not present

## 2020-05-18 DIAGNOSIS — Z96619 Presence of unspecified artificial shoulder joint: Secondary | ICD-10-CM | POA: Insufficient documentation

## 2020-05-18 DIAGNOSIS — R079 Chest pain, unspecified: Secondary | ICD-10-CM

## 2020-05-18 DIAGNOSIS — R110 Nausea: Secondary | ICD-10-CM | POA: Insufficient documentation

## 2020-05-18 DIAGNOSIS — I25111 Atherosclerotic heart disease of native coronary artery with angina pectoris with documented spasm: Secondary | ICD-10-CM | POA: Diagnosis not present

## 2020-05-18 DIAGNOSIS — Z96649 Presence of unspecified artificial hip joint: Secondary | ICD-10-CM | POA: Diagnosis not present

## 2020-05-18 DIAGNOSIS — Z79899 Other long term (current) drug therapy: Secondary | ICD-10-CM | POA: Insufficient documentation

## 2020-05-18 DIAGNOSIS — Z7982 Long term (current) use of aspirin: Secondary | ICD-10-CM | POA: Diagnosis not present

## 2020-05-18 DIAGNOSIS — F17210 Nicotine dependence, cigarettes, uncomplicated: Secondary | ICD-10-CM | POA: Diagnosis not present

## 2020-05-18 LAB — LIPASE, BLOOD: Lipase: 34 U/L (ref 11–51)

## 2020-05-18 LAB — CBC WITH DIFFERENTIAL/PLATELET
Abs Immature Granulocytes: 0.02 10*3/uL (ref 0.00–0.07)
Basophils Absolute: 0.1 10*3/uL (ref 0.0–0.1)
Basophils Relative: 1 %
Eosinophils Absolute: 0.1 10*3/uL (ref 0.0–0.5)
Eosinophils Relative: 1 %
HCT: 40.4 % (ref 39.0–52.0)
Hemoglobin: 14.1 g/dL (ref 13.0–17.0)
Immature Granulocytes: 0 %
Lymphocytes Relative: 34 %
Lymphs Abs: 3.1 10*3/uL (ref 0.7–4.0)
MCH: 32.3 pg (ref 26.0–34.0)
MCHC: 34.9 g/dL (ref 30.0–36.0)
MCV: 92.4 fL (ref 80.0–100.0)
Monocytes Absolute: 0.6 10*3/uL (ref 0.1–1.0)
Monocytes Relative: 7 %
Neutro Abs: 5.3 10*3/uL (ref 1.7–7.7)
Neutrophils Relative %: 57 %
Platelets: 229 10*3/uL (ref 150–400)
RBC: 4.37 MIL/uL (ref 4.22–5.81)
RDW: 13.5 % (ref 11.5–15.5)
WBC: 9.3 10*3/uL (ref 4.0–10.5)
nRBC: 0 % (ref 0.0–0.2)

## 2020-05-18 LAB — COMPREHENSIVE METABOLIC PANEL
ALT: 17 U/L (ref 0–44)
AST: 18 U/L (ref 15–41)
Albumin: 4.3 g/dL (ref 3.5–5.0)
Alkaline Phosphatase: 67 U/L (ref 38–126)
Anion gap: 12 (ref 5–15)
BUN: 12 mg/dL (ref 6–20)
CO2: 23 mmol/L (ref 22–32)
Calcium: 9 mg/dL (ref 8.9–10.3)
Chloride: 101 mmol/L (ref 98–111)
Creatinine, Ser: 0.94 mg/dL (ref 0.61–1.24)
GFR, Estimated: >60 mL/min (ref >60)
Glucose, Bld: 100 mg/dL — ABNORMAL HIGH (ref 70–99)
Potassium: 3.4 mmol/L — ABNORMAL LOW (ref 3.5–5.1)
Sodium: 136 mmol/L (ref 135–145)
Total Bilirubin: 0.8 mg/dL (ref 0.3–1.2)
Total Protein: 8.1 g/dL (ref 6.5–8.1)

## 2020-05-18 LAB — TROPONIN I (HIGH SENSITIVITY)
Troponin I (High Sensitivity): 6 ng/L (ref <18)
Troponin I (High Sensitivity): 5 ng/L (ref <18)

## 2020-05-18 LAB — D-DIMER, QUANTITATIVE: D-Dimer, Quant: 4.02 ug/mL-FEU — ABNORMAL HIGH (ref 0.00–0.50)

## 2020-05-18 MED ORDER — ONDANSETRON 4 MG PO TBDP: 4.0000 mg | ORAL_TABLET | Freq: Three times a day (TID) | ORAL | 0 refills | Status: AC | PRN

## 2020-05-18 MED ORDER — ASPIRIN 81 MG PO CHEW
324.0000 mg | CHEWABLE_TABLET | Freq: Once | ORAL | Status: AC
  Administered 2020-05-18: 324 mg via ORAL
  Filled 2020-05-18: qty 4

## 2020-05-18 MED ORDER — PREDNISONE 20 MG PO TABS: 40.0000 mg | ORAL_TABLET | Freq: Every day | ORAL | 0 refills | Status: AC

## 2020-05-18 MED ORDER — ONDANSETRON HCL 4 MG/2ML IJ SOLN
4.0000 mg | Freq: Once | INTRAMUSCULAR | Status: AC
Start: 2020-05-18 — End: 2020-05-18
  Administered 2020-05-18: 4 mg via INTRAVENOUS
  Filled 2020-05-18: qty 2

## 2020-05-18 MED ORDER — KETOROLAC TROMETHAMINE 15 MG/ML IJ SOLN
15.0000 mg | Freq: Once | INTRAMUSCULAR | Status: AC
  Administered 2020-05-18: 15 mg via INTRAVENOUS
  Filled 2020-05-18: qty 1

## 2020-05-18 MED ORDER — OXYCODONE-ACETAMINOPHEN 5-325 MG PO TABS
1.0000 | ORAL_TABLET | Freq: Four times a day (QID) | ORAL | 0 refills | Status: DC | PRN
Start: 2020-05-18 — End: 2021-12-14

## 2020-05-18 MED ORDER — MORPHINE SULFATE (PF) 4 MG/ML IV SOLN
4.0000 mg | Freq: Once | INTRAVENOUS | Status: AC
  Administered 2020-05-18: 4 mg via INTRAVENOUS
  Filled 2020-05-18: qty 1

## 2020-05-18 MED ORDER — OXYCODONE-ACETAMINOPHEN 5-325 MG PO TABS
1.0000 | ORAL_TABLET | Freq: Once | ORAL | Status: AC
  Administered 2020-05-18: 1 via ORAL
  Filled 2020-05-18: qty 1

## 2020-05-18 MED ORDER — OXYCODONE-ACETAMINOPHEN 5-325 MG PO TABS
1.0000 | ORAL_TABLET | Freq: Four times a day (QID) | ORAL | 0 refills | Status: DC | PRN
Start: 2020-05-18 — End: 2020-05-18

## 2020-05-18 MED ORDER — NITROGLYCERIN 0.4 MG SL SUBL
0.4000 mg | SUBLINGUAL_TABLET | SUBLINGUAL | Status: AC | PRN
  Administered 2020-05-18 (×3): 0.4 mg via SUBLINGUAL
  Filled 2020-05-18: qty 1

## 2020-05-18 MED ORDER — IOHEXOL 350 MG/ML SOLN
100.0000 mL | Freq: Once | INTRAVENOUS | Status: AC | PRN
  Administered 2020-05-18: 100 mL via INTRAVENOUS

## 2020-05-18 NOTE — ED Notes (Addendum)
C/o of having chills, warm blankets applied . Pain worse with movement and cough , " throbbing" to chest

## 2020-05-18 NOTE — ED Notes (Signed)
Patient transported to CT 

## 2020-05-18 NOTE — ED Notes (Signed)
Pt on monitor and vitals cycling 

## 2020-05-18 NOTE — ED Notes (Signed)
ED Provider at bedside. 

## 2020-05-18 NOTE — ED Provider Notes (Signed)
[facility] EMERGENCY DEPARTMENT Provider Note   CSN: [id number] Arrival date & time: 05/18/20  1800     History Chief Complaint  Patient presents with  . Chest Pain    Christopher Nunez is a 54 y.o. male.  Pt is a 54y/o male with hx of STEMI status post PCI in 2016, cutaneous lupus, hypertension, hyperlipidemia presenting today with chest pressure, nausea, chills that woke him up at 4 AM today.  He has had ongoing discomfort in his chest in the center and right side of his chest with some minimal shortness of breath, mild cough throughout the day.  He had one episode of vomiting after arriving here and reports now the pain is improving some.  He describes the pain as a pressure in his chest like someone is sitting on him.  It does not radiate to his back neck or arms.  He did take some nitroglycerin earlier today and reported it did relieve the pain a little bit.  He denies any abdominal pain at this time.  He has had no diarrhea.  No known stomach issues.  Patient did have chest pain and was seen at [facility] on 05/04/2020.  At that time he had a left heart catheterization which showed no acute changes with his known stents in the RCA that are occluded but good mature collateralization.  It was felt at that time that his chest pain was not cardiac.  Patient has been doing fine at home until waking up at 4 AM this morning with the above symptoms.  He has received his Covid vaccine and has had no known sick contacts.  No known food exposure.  The history is provided by the patient, the spouse and medical records.  Chest Pain      Past Medical History:  Diagnosis Date  . CAD S/P percutaneous coronary angioplasty    2007: PCI to RCA, 06/2014: 2.5 x 18 mm Resolute Integrity DES to RCA, for ISR per wife  . Essential hypertension    poorly controlled  . Hyperlipidemia with target LDL less than 70   . Lupus (systemic lupus erythematosus) (HCC)   . Myocardial infarction  (HCC) 08/13/2005   Stent prob to RCA  . Unstable angina pectoris (HCC) 07/07/2014   RCA stent ISR -- PCI    Patient Active Problem List   Diagnosis Date Noted  . CAD S/P percutaneous coronary angioplasty   . Stenosis of coronary stent   . NSTEMI (non-ST elevated myocardial infarction) (HCC) 11/08/2014  . Hyperlipidemia with target LDL less than 70   . Essential hypertension   . Lupus (systemic lupus erythematosus) (HCC)     Past Surgical History:  Procedure Laterality Date  . CARDIAC CATHETERIZATION N/A 11/09/2014   Procedure: Left Heart Cath and Coronary Angiography;  Surgeon: Marykay Lex, MD;  Location: [facility] INVASIVE CV LAB CUPID;  Service: Cardiovascular;  Laterality: N/A;  . CORONARY ANGIOPLASTY WITH STENT PLACEMENT     2005 & 2015  . PERCUTANEOUS CORONARY STENT INTERVENTION (PCI-S)  11/09/2014   Procedure: Percutaneous Coronary Stent Intervention (Pci-S);  Surgeon: Marykay Lex, MD;  Location: [facility] INVASIVE CV LAB CUPID;  Service: Cardiovascular;;  . TOTAL HIP ARTHROPLASTY    . TOTAL SHOULDER REPLACEMENT         History reviewed. No pertinent family history.  Social History   Tobacco Use  . Smoking status: Current Every Day Smoker    Packs/day: 0.50    Years: 30.00  Pack years: 15.00    Types: Cigarettes  . Smokeless tobacco: Never Used  Vaping Use  . Vaping Use: Never used  Substance Use Topics  . Alcohol use: Yes    Alcohol/week: 0.0 standard drinks    Comment: occasionally  . Drug use: No    Home Medications Prior to Admission medications   Medication Sig Start Date End Date Taking? Authorizing Provider  amLODipine (NORVASC) 10 MG tablet Take 1 tablet (10 mg total) by mouth daily. 11/10/14  Yes Dwana Melena, PA-C  aspirin EC 81 MG tablet Take 162 mg by mouth daily.   Yes [provider]  clopidogrel (PLAVIX) 75 MG tablet Take 75 mg by mouth daily.   Yes [provider]  DULoxetine (CYMBALTA) 60 MG capsule Take 60 mg by mouth daily.    Yes [provider]  gabapentin (NEURONTIN) 600 MG tablet Take 600 mg by mouth 3 (three) times daily.   Yes [provider]  hydroxychloroquine (PLAQUENIL) 200 MG tablet Take 200 mg by mouth 2 (two) times daily.   Yes [provider]  lisinopril (PRINIVIL,ZESTRIL) 20 MG tablet Take 20 mg by mouth daily.   Yes [provider]  metoprolol succinate (TOPROL-XL) 50 MG 24 hr tablet Take 50 mg by mouth daily. Take with or immediately following a meal.   Yes [provider]  nitroGLYCERIN (NITROLINGUAL) 0.4 MG/SPRAY spray Place 1 spray under the tongue every 5 (five) minutes x 3 doses as needed for chest pain.   Yes [provider]  tiZANidine (ZANAFLEX) 4 MG tablet Take 4 mg by mouth 3 (three) times daily.    Yes [provider]  atorvastatin (LIPITOR) 80 MG tablet Take 80 mg by mouth daily. 05/09/20   [provider]  clobetasol cream (TEMOVATE) 0.05 % Apply 1 application topically daily. Apply to head for rash from lupus    [provider]  diclofenac Sodium (VOLTAREN) 1 % GEL Apply 4 g topically 4 (four) times daily. 05/04/20   [provider]  hydrocortisone 2.5 % ointment Apply 1 application topically daily. Apply a thin layer to face for rash from lupus    [provider]  oxycodone (OXY-IR) 5 MG capsule Take 5 mg by mouth 3 (three) times daily. scheduled    [provider]  pravastatin (PRAVACHOL) 40 MG tablet Take 40 mg by mouth daily.    [provider]    Allergies    Hydrocodone and Tramadol  Review of Systems   Review of Systems  Cardiovascular: Positive for chest pain.  All other systems reviewed and are negative.   Physical Exam Updated Vital Signs BP 133/86   Pulse 62   Temp 98.4 F (36.9 C) (Oral)   Resp 14   Ht  (1.854 m)   Wt 86.2 kg   SpO2 99%   BMI 25.07 kg/m   Physical Exam Vitals and nursing note reviewed.  Constitutional:      General: He is  not in acute distress.    Appearance: He is well-developed and normal weight. He is diaphoretic.  HENT:     Head: Normocephalic and atraumatic.     Mouth/Throat:     Mouth: Mucous membranes are moist.  Eyes:     Conjunctiva/sclera: Conjunctivae normal.     Pupils: Pupils are equal, round, and reactive to light.  Cardiovascular:     Rate and Rhythm: Normal rate and regular rhythm.     Pulses: Normal pulses.  Radial pulses are 2+ on the right side and 2+ on the left side.       Dorsalis pedis pulses are 2+ on the right side and 2+ on the left side.     Heart sounds: No murmur heard.   Pulmonary:     Effort: Pulmonary effort is normal. No respiratory distress.     Breath sounds: Normal breath sounds. No wheezing or rales.  Abdominal:     General: Bowel sounds are normal. There is no distension.     Palpations: Abdomen is soft.     Tenderness: There is no abdominal tenderness. There is no guarding or rebound.  Musculoskeletal:        General: No tenderness. Normal range of motion.     Cervical back: Normal range of motion and neck supple.  Skin:    General: Skin is warm.     Capillary Refill: Capillary refill takes 2 to 3 seconds.     Findings: No erythema or rash.  Neurological:     General: No focal deficit present.     Mental Status: He is alert and oriented to person, place, and time. Mental status is at baseline.  Psychiatric:        Mood and Affect: Mood normal.        Behavior: Behavior normal.        Thought Content: Thought content normal.     ED Results / Procedures / Treatments   Labs (all labs ordered are listed, but only abnormal results are displayed) Labs Reviewed  COMPREHENSIVE METABOLIC PANEL - Abnormal; Notable for the following components:      Result Value   Potassium 3.4 (*)    Glucose, Bld 100 (*)    All other components within normal limits  D-DIMER, QUANTITATIVE (NOT AT [facility]) - Abnormal; Notable for the following components:   D-Dimer,  Quant 4.02 (*)    All other components within normal limits  CBC WITH DIFFERENTIAL/PLATELET  LIPASE, BLOOD  TROPONIN I (HIGH SENSITIVITY)  TROPONIN I (HIGH SENSITIVITY)    EKG None  ED ECG REPORT   Date: 05/18/2020  Rate: 59   Rhythm: normal sinus rhythm  QRS Axis: normal  Intervals: normal  ST/T Wave abnormalities: nonspecific ST/T changes  Conduction Disutrbances:none  Narrative Interpretation:   Old EKG Reviewed: unchanged  I have personally reviewed the EKG tracing and agree with the computerized printout as noted.  Radiology CT Angio Chest PE W and/or Wo Contrast  Result Date: 05/18/2020 CLINICAL DATA:  Positive D-dimer.  Concern for pulmonary embolism. EXAM: CT ANGIOGRAPHY CHEST WITH CONTRAST TECHNIQUE: Multidetector CT imaging of the chest was performed using the standard protocol during bolus administration of intravenous contrast. Multiplanar CT image reconstructions and MIPs were obtained to evaluate the vascular anatomy. CONTRAST:  OMNIPAQUE IOHEXOL 350 MG/ML SOLN COMPARISON:  Radiograph 05/18/2020, CT 06/16/2014 FINDINGS: Cardiovascular: No filling defects within the pulmonary arteries to suggest acute pulmonary embolism. Mediastinum/Nodes: No axillary or supraclavicular adenopathy. No mediastinal or hilar adenopathy. No pericardial fluid. Esophagus normal. Lungs/Pleura: Centrilobular emphysema the upper lobes. No pulmonary infarction. Pneumonia. Within the RIGHT upper lobe, 5 mm nodule abuts the pleural surface on image 62/7). This nodule measures slightly larger than 4 mm on CT 06/16/2014 Upper Abdomen: Limited view of the liver, kidneys, pancreas are unremarkable. Normal adrenal glands. Musculoskeletal: No aggressive osseous lesion. Review of the MIP images confirms the above findings. IMPRESSION: 1. No evidence acute pulmonary embolism. 2. Centrilobular emphysema the upper lobes. 3. RIGHT upper  lobe pulmonary nodule. Minimal enlargement over 6 year interval favors  benign nodule. Recommend additional follow-up CT in 12 months. Electronically Signed   By: Genevive Bi M.D.   On: 05/18/2020 20:30   DG Chest Port 1 View  Result Date: 05/18/2020 CLINICAL DATA:  Chest pain EXAM: PORTABLE CHEST 1 VIEW COMPARISON:  05/03/2020 FINDINGS: The heart size and mediastinal contours are within normal limits. Both lungs are clear. The visualized skeletal structures are unremarkable. IMPRESSION: No active disease. Electronically Signed   By: Katherine Mantle M.D.   On: 05/18/2020 19:07    Procedures Procedures (including critical care time)  Medications Ordered in ED Medications  nitroGLYCERIN (NITROSTAT) SL tablet 0.4 mg (has no administration in time range)  aspirin chewable tablet 324 mg (has no administration in time range)  ondansetron (ZOFRAN) injection 4 mg (4 mg Intravenous Given 05/18/20 1828)    ED Course  I have reviewed the triage vital signs and the nursing notes.  Pertinent labs & imaging results that were available during my care of the patient were reviewed by me and considered in my medical decision making (see chart for details).    MDM Rules/Calculators/A&P                          54 year old male with known coronary artery disease status post stents with left heart cath done 2 weeks ago without acute disease presenting today with chest pain that is in the center and right side of his chest that is been present all day long with diaphoresis, one episode of vomiting upon arrival here and some minimal shortness of breath.  He has experienced chills today but denies any fever.  Patient has no abdominal pain on exam denies alcohol use but does have ongoing tobacco use daily.  He denies any drug use.  He does also have a history of lupus.  No known abdominal issues and no prior surgeries.  Low suspicion for small bowel obstruction at this time as patient has no abdominal pain and good bowel sounds.  Pain does not radiate and is not ripping or  tearing in nature.  Pulses are present in all 4 extremities and low suspicion for dissection.  Pain is not classic for PE.  Lower suspicion for Covid or pneumonia.  Patient's EKG with minimal ST depression inferiorly which is unchanged from 2016.  Chest x-ray and labs are pending.  Possible ACS however given recent normal catheterization lower suspicion.  Possible GI source.  Will check lipase and LFTs.  Patient given aspirin, nitroglycerin and Zofran as he continues to have nausea.  9:55 PM After patient received nitroglycerin initially he reported the pain was better and then after the second nitro he reported the pain was worse and now worse with deep breathing and with coughing.  D-dimer was elevated at 4 but CT was negative for PE.  Patient's troponin x2 is negative and lower suspicion for ACS at this time given recent reassuring catheterization and negative troponins and unchanged EKG.  Patient's LFTs and lipase are within normal limits.  After 4 of morphine patient reports his pain was much improved.  After several hours on reevaluation he reports the pain is starting to come back but the nausea has resolved.  We will p.o. challenge the patient.  Give further pain control.  He has no wheezing at this time and sats are 100% on room air.  11:17 PM Pain is controlled after second dose of  morphine and Toradol.  Patient's pain may be a result of a lupus flare.  Will give 5 days of prednisone in addition to pain control to use at home.  Encouraged him to follow-up closely with his doctor and given return precautions.  MDM Number of Diagnoses or Management Options   Amount and/or Complexity of Data Reviewed Clinical lab tests: ordered and reviewed Tests in the radiology section of CPT: ordered and reviewed Tests in the medicine section of CPT: ordered and reviewed Decide to obtain previous medical records or to obtain history from someone other than the patient: yes Obtain history from someone  other than the patient: yes Review and summarize past medical records: yes Discuss the patient with other providers: no Independent visualization of images, tracings, or specimens: yes  Risk of Complications, Morbidity, and/or Mortality Presenting problems: moderate Diagnostic procedures: low Management options: low  Patient Progress Patient progress: improved    Final Clinical Impression(s) / ED Diagnoses Final diagnoses:  Nonspecific chest pain  Chest wall pain    Rx / DC Orders ED Discharge Orders         Ordered    oxyCODONE-acetaminophen (PERCOCET/ROXICET) 5-325 MG tablet  Every 6 hours PRN,   Status:  Discontinued        05/18/20 2314    ondansetron (ZOFRAN ODT) 4 MG disintegrating tablet  Every 8 hours PRN        05/18/20 2314    predniSONE (DELTASONE) 20 MG tablet  Daily        05/18/20 2314    oxyCODONE-acetaminophen (PERCOCET/ROXICET) 5-325 MG tablet  Every 6 hours PRN        05/18/20 2315           Gwyneth SproutPlunkett, Zeena Starkel, MD 05/18/20 2318

## 2020-05-18 NOTE — Discharge Instructions (Signed)
-  Blood test to your heart today looked normal and your EKG was normal.  There was no sign of blood clots in your lung and no signs of pneumonia.  The chest pain may be related to your lupus.  There is no evidence of anything wrong with your gallbladder or liver today.  Use the pain medication you were prescribed as needed for the pain you can also use nausea medicine as needed.  You were given a prescription for 5 days of prednisone in case it is the lupus that is flared this should help.  If you start having severe shortness of breath, high fever or the pain becomes severe please return to the emergency room.

## 2020-05-18 NOTE — ED Triage Notes (Addendum)
C/o right sided chest pain ,  described as heaviness x 14 hrs , also c/o nausea and diaphoretic.

## 2020-05-23 ENCOUNTER — Encounter (HOSPITAL_BASED_OUTPATIENT_CLINIC_OR_DEPARTMENT_OTHER): Payer: Self-pay | Admitting: Emergency Medicine

## 2020-05-23 ENCOUNTER — Other Ambulatory Visit: Payer: Self-pay

## 2020-05-23 ENCOUNTER — Emergency Department (HOSPITAL_BASED_OUTPATIENT_CLINIC_OR_DEPARTMENT_OTHER)
Admission: EM | Admit: 2020-05-23 | Discharge: 2020-05-23 | Disposition: A | Discharge status: Home / Self Care | Payer: Medicare Other | Attending: Emergency Medicine | Admitting: Emergency Medicine

## 2020-05-23 ENCOUNTER — Emergency Department (HOSPITAL_BASED_OUTPATIENT_CLINIC_OR_DEPARTMENT_OTHER): Payer: Medicare Other

## 2020-05-23 DIAGNOSIS — Z96619 Presence of unspecified artificial shoulder joint: Secondary | ICD-10-CM | POA: Insufficient documentation

## 2020-05-23 DIAGNOSIS — Z79899 Other long term (current) drug therapy: Secondary | ICD-10-CM | POA: Insufficient documentation

## 2020-05-23 DIAGNOSIS — R109 Unspecified abdominal pain: Secondary | ICD-10-CM | POA: Diagnosis not present

## 2020-05-23 DIAGNOSIS — I10 Essential (primary) hypertension: Secondary | ICD-10-CM | POA: Insufficient documentation

## 2020-05-23 DIAGNOSIS — R1013 Epigastric pain: Secondary | ICD-10-CM | POA: Diagnosis not present

## 2020-05-23 DIAGNOSIS — R079 Chest pain, unspecified: Secondary | ICD-10-CM | POA: Insufficient documentation

## 2020-05-23 DIAGNOSIS — R0602 Shortness of breath: Secondary | ICD-10-CM | POA: Diagnosis not present

## 2020-05-23 DIAGNOSIS — Z96649 Presence of unspecified artificial hip joint: Secondary | ICD-10-CM | POA: Insufficient documentation

## 2020-05-23 DIAGNOSIS — F17210 Nicotine dependence, cigarettes, uncomplicated: Secondary | ICD-10-CM | POA: Insufficient documentation

## 2020-05-23 DIAGNOSIS — Z955 Presence of coronary angioplasty implant and graft: Secondary | ICD-10-CM | POA: Diagnosis not present

## 2020-05-23 LAB — COMPREHENSIVE METABOLIC PANEL
ALT: 100 U/L — ABNORMAL HIGH (ref 0–44)
AST: 62 U/L — ABNORMAL HIGH (ref 15–41)
Albumin: 4 g/dL (ref 3.5–5.0)
Alkaline Phosphatase: 122 U/L (ref 38–126)
Anion gap: 10 (ref 5–15)
BUN: 9 mg/dL (ref 6–20)
CO2: 24 mmol/L (ref 22–32)
Calcium: 8.9 mg/dL (ref 8.9–10.3)
Chloride: 101 mmol/L (ref 98–111)
Creatinine, Ser: 0.89 mg/dL (ref 0.61–1.24)
GFR, Estimated: >60 mL/min (ref >60)
Glucose, Bld: 110 mg/dL — ABNORMAL HIGH (ref 70–99)
Potassium: 3.6 mmol/L (ref 3.5–5.1)
Sodium: 135 mmol/L (ref 135–145)
Total Bilirubin: 0.5 mg/dL (ref 0.3–1.2)
Total Protein: 7.8 g/dL (ref 6.5–8.1)

## 2020-05-23 LAB — CBC WITH DIFFERENTIAL/PLATELET
Abs Immature Granulocytes: 0.02 10*3/uL (ref 0.00–0.07)
Basophils Absolute: 0 10*3/uL (ref 0.0–0.1)
Basophils Relative: 1 %
Eosinophils Absolute: 0 10*3/uL (ref 0.0–0.5)
Eosinophils Relative: 0 %
HCT: 39 % (ref 39.0–52.0)
Hemoglobin: 13.2 g/dL (ref 13.0–17.0)
Immature Granulocytes: 0 %
Lymphocytes Relative: 30 %
Lymphs Abs: 2.5 10*3/uL (ref 0.7–4.0)
MCH: 31.3 pg (ref 26.0–34.0)
MCHC: 33.8 g/dL (ref 30.0–36.0)
MCV: 92.4 fL (ref 80.0–100.0)
Monocytes Absolute: 0.4 10*3/uL (ref 0.1–1.0)
Monocytes Relative: 5 %
Neutro Abs: 5.4 10*3/uL (ref 1.7–7.7)
Neutrophils Relative %: 64 %
Platelets: 223 10*3/uL (ref 150–400)
RBC: 4.22 MIL/uL (ref 4.22–5.81)
RDW: 13.2 % (ref 11.5–15.5)
WBC: 8.3 10*3/uL (ref 4.0–10.5)
nRBC: 0 % (ref 0.0–0.2)

## 2020-05-23 LAB — HEPATITIS PANEL, ACUTE
HCV Ab: NONREACTIVE
Hep A IgM: NONREACTIVE
Hep B C IgM: NONREACTIVE
Hepatitis B Surface Ag: NONREACTIVE

## 2020-05-23 LAB — TROPONIN I (HIGH SENSITIVITY)
Troponin I (High Sensitivity): 5 ng/L (ref <18)
Troponin I (High Sensitivity): 5 ng/L (ref <18)

## 2020-05-23 LAB — LIPASE, BLOOD: Lipase: 35 U/L (ref 11–51)

## 2020-05-23 MED ORDER — PANTOPRAZOLE SODIUM 20 MG PO TBEC: 20.0000 mg | DELAYED_RELEASE_TABLET | Freq: Every day | ORAL | 1 refills | Status: AC

## 2020-05-23 MED ORDER — SUCRALFATE 1 GM/10ML PO SUSP: 1.0000 g | Freq: Three times a day (TID) | ORAL | 0 refills | Status: AC

## 2020-05-23 MED ORDER — PANTOPRAZOLE SODIUM 40 MG PO TBEC
40.0000 mg | DELAYED_RELEASE_TABLET | Freq: Once | ORAL | Status: AC
  Administered 2020-05-23: 40 mg via ORAL
  Filled 2020-05-23: qty 1

## 2020-05-23 MED ORDER — ONDANSETRON HCL 4 MG/2ML IJ SOLN
4.0000 mg | Freq: Once | INTRAMUSCULAR | Status: AC
  Administered 2020-05-23: 4 mg via INTRAVENOUS
  Filled 2020-05-23: qty 2

## 2020-05-23 MED ORDER — MORPHINE SULFATE (PF) 4 MG/ML IV SOLN
4.0000 mg | Freq: Once | INTRAVENOUS | Status: AC
  Administered 2020-05-23: 4 mg via INTRAVENOUS
  Filled 2020-05-23: qty 1

## 2020-05-23 MED ORDER — IOHEXOL 350 MG/ML SOLN
100.0000 mL | Freq: Once | INTRAVENOUS | Status: AC | PRN
  Administered 2020-05-23: 100 mL via INTRAVENOUS

## 2020-05-23 MED ORDER — FAMOTIDINE 20 MG PO TABS: 20.0000 mg | ORAL_TABLET | Freq: Two times a day (BID) | ORAL | 0 refills | Status: AC

## 2020-05-23 MED ORDER — FAMOTIDINE 20 MG PO TABS
20.0000 mg | ORAL_TABLET | Freq: Once | ORAL | Status: AC
  Administered 2020-05-23: 20 mg via ORAL
  Filled 2020-05-23: qty 1

## 2020-05-23 NOTE — ED Triage Notes (Signed)
abd pain since 4 am vomited and then had cp some sob he states

## 2020-05-23 NOTE — ED Provider Notes (Signed)
[facility] EMERGENCY DEPARTMENT Provider Note   CSN: [id number] Arrival date & time: 05/23/20  1126     History Chief Complaint  Patient presents with  . Abdominal Pain  . Vomiting    Christopher Nunez is a 54 y.o. male.  HPI      Christopher Nunez is a 54 y.o. male, with a history of lupus, MI, hyperlipidemia, HTN, presenting to the ED with chest pain beginning around 4 AM this morning. He states pain woke him from sleep, severe, felt like a pressure in the retrosternal region, extending inferiorly into the upper abdomen.  Accompanied by diaphoresis as well as nausea and vomiting. He took 2 doses of sublingual nitroglycerin around 5 AM without improvement. He has pain was consistently severe until after he took 2 Percocet around 11 AM. Currently, his pain is described as a dull ache, mild to moderate.  Nausea and vomiting have resolved.  He states he was seen in the ED on November 10 for pain that felt similar in the chest, however, that instance did not involve pain in the abdomen and he feels as though his pain today was worse.  He was prescribed a course of prednisone after that visit, which she states improved his symptoms.  He has not yet followed up with his doctor who handles his lupus.  Denies fever, diarrhea, constipation, numbness, weakness, urinary symptoms, dizziness, syncope, shortness of breath, or any other complaints.    Past Medical History:  Diagnosis Date  . CAD S/P percutaneous coronary angioplasty    2007: PCI to RCA, 06/2014: 2.5 x 18 mm Resolute Integrity DES to RCA, for ISR per wife  . Essential hypertension    poorly controlled  . Hyperlipidemia with target LDL less than 70   . Lupus (systemic lupus erythematosus) (HCC)   . Myocardial infarction (HCC) 08/13/2005   Stent prob to RCA  . Unstable angina pectoris (HCC) 07/07/2014   RCA stent ISR -- PCI    Patient Active Problem List   Diagnosis Date Noted  . CAD S/P percutaneous coronary  angioplasty   . Stenosis of coronary stent   . NSTEMI (non-ST elevated myocardial infarction) (HCC) 11/08/2014  . Hyperlipidemia with target LDL less than 70   . Essential hypertension   . Lupus (systemic lupus erythematosus) (HCC)     Past Surgical History:  Procedure Laterality Date  . CARDIAC CATHETERIZATION N/A 11/09/2014   Procedure: Left Heart Cath and Coronary Angiography;  Surgeon: Marykay Lex, MD;  Location: [facility] INVASIVE CV LAB CUPID;  Service: Cardiovascular;  Laterality: N/A;  . CORONARY ANGIOPLASTY WITH STENT PLACEMENT     2005 & 2015  . PERCUTANEOUS CORONARY STENT INTERVENTION (PCI-S)  11/09/2014   Procedure: Percutaneous Coronary Stent Intervention (Pci-S);  Surgeon: Marykay Lex, MD;  Location: [facility] INVASIVE CV LAB CUPID;  Service: Cardiovascular;;  . TOTAL HIP ARTHROPLASTY    . TOTAL SHOULDER REPLACEMENT         No family history on file.  Social History   Tobacco Use  . Smoking status: Current Every Day Smoker    Packs/day: 0.50    Years: 30.00    Pack years: 15.00    Types: Cigarettes  . Smokeless tobacco: Never Used  Vaping Use  . Vaping Use: Never used  Substance Use Topics  . Alcohol use: Yes    Alcohol/week: 0.0 standard drinks    Comment: occasionally  . Drug use: No    Home Medications Prior to Admission medications  Medication Sig Start Date End Date Taking? Authorizing Provider  amLODipine (NORVASC) 10 MG tablet Take 1 tablet (10 mg total) by mouth daily. 11/10/14   Dwana Melena, PA-C  aspirin EC 81 MG tablet Take 162 mg by mouth daily.    [provider]  atorvastatin (LIPITOR) 80 MG tablet Take 80 mg by mouth daily. 05/09/20   [provider]  clobetasol cream (TEMOVATE) 0.05 % Apply 1 application topically daily. Apply to head for rash from lupus    [provider]  clopidogrel (PLAVIX) 75 MG tablet Take 75 mg by mouth daily.    [provider]  diclofenac Sodium (VOLTAREN) 1 % GEL Apply 4 g topically 4  (four) times daily. 05/04/20   [provider]  DULoxetine (CYMBALTA) 60 MG capsule Take 60 mg by mouth daily.    [provider]  famotidine (PEPCID) 20 MG tablet Take 1 tablet (20 mg total) by mouth 2 (two) times daily for 5 days. 05/23/20 05/28/20  Ericha Whittingham C, PA-C  gabapentin (NEURONTIN) 600 MG tablet Take 600 mg by mouth 3 (three) times daily.    [provider]  hydrocortisone 2.5 % ointment Apply 1 application topically daily. Apply a thin layer to face for rash from lupus    [provider]  hydroxychloroquine (PLAQUENIL) 200 MG tablet Take 200 mg by mouth 2 (two) times daily.    [provider]  lisinopril (PRINIVIL,ZESTRIL) 20 MG tablet Take 20 mg by mouth daily.    [provider]  metoprolol succinate (TOPROL-XL) 50 MG 24 hr tablet Take 50 mg by mouth daily. Take with or immediately following a meal.    [provider]  nitroGLYCERIN (NITROLINGUAL) 0.4 MG/SPRAY spray Place 1 spray under the tongue every 5 (five) minutes x 3 doses as needed for chest pain.    [provider]  ondansetron (ZOFRAN ODT) 4 MG disintegrating tablet Take 1 tablet (4 mg total) by mouth every 8 (eight) hours as needed for nausea or vomiting. 05/18/20   Gwyneth Sprout, MD  oxycodone (OXY-IR) 5 MG capsule Take 5 mg by mouth 3 (three) times daily. scheduled    [provider]  oxyCODONE-acetaminophen (PERCOCET/ROXICET) 5-325 MG tablet Take 1 tablet by mouth every 6 (six) hours as needed for severe pain. 05/18/20   Gwyneth Sprout, MD  pantoprazole (PROTONIX) 20 MG tablet Take 1 tablet (20 mg total) by mouth daily. 05/23/20 07/22/20  Eliya Geiman C, PA-C  pravastatin (PRAVACHOL) 40 MG tablet Take 40 mg by mouth daily.    [provider]  predniSONE (DELTASONE) 20 MG tablet Take 2 tablets (40 mg total) by mouth daily. 05/18/20   Gwyneth Sprout, MD  sucralfate (CARAFATE) 1 GM/10ML suspension Take 10 mLs (1 g total) by mouth 4  (four) times daily -  with meals and at bedtime. 05/23/20   Markeita Alicia C, PA-C  tiZANidine (ZANAFLEX) 4 MG tablet Take 4 mg by mouth 3 (three) times daily.     [provider]    Allergies    Hydrocodone and Tramadol  Review of Systems   Review of Systems  Constitutional: Positive for diaphoresis. Negative for fever.  Respiratory: Negative for cough and shortness of breath.   Cardiovascular: Positive for chest pain. Negative for leg swelling.  Gastrointestinal: Positive for abdominal pain, nausea and vomiting. Negative for blood in stool, constipation and diarrhea.  Genitourinary: Negative for dysuria, frequency, hematuria and testicular pain.  Musculoskeletal: Negative for back pain.  Neurological: Negative  for dizziness, syncope, weakness and numbness.  All other systems reviewed and are negative.   Physical Exam Updated Vital Signs BP 124/81 (BP Location: Right Arm)   Pulse 64   Temp 98.5 F (36.9 C) (Oral)   Resp 18   Ht 6\' 1"  (1.854 m)   Wt 86.6 kg   SpO2 98%   BMI 25.20 kg/m   Physical Exam Vitals and nursing note reviewed.  Constitutional:      General: He is not in acute distress.    Appearance: He is well-developed. He is not diaphoretic.  HENT:     Head: Normocephalic and atraumatic.     Mouth/Throat:     Mouth: Mucous membranes are moist.     Pharynx: Oropharynx is clear.  Eyes:     Conjunctiva/sclera: Conjunctivae normal.  Cardiovascular:     Rate and Rhythm: Normal rate and regular rhythm.     Pulses: Normal pulses.          Radial pulses are 2+ on the right side and 2+ on the left side.       Posterior tibial pulses are 2+ on the right side and 2+ on the left side.     Heart sounds: Normal heart sounds.     Comments: Tactile temperature in the extremities appropriate and equal bilaterally. Pulmonary:     Effort: Pulmonary effort is normal. No respiratory distress.     Breath sounds: Normal breath sounds.  Abdominal:     Palpations: Abdomen  is soft.     Tenderness: There is abdominal tenderness in the epigastric area and periumbilical area. There is no guarding.  Musculoskeletal:     Cervical back: Neck supple.     Right lower leg: No edema.     Left lower leg: No edema.  Lymphadenopathy:     Cervical: No cervical adenopathy.  Skin:    General: Skin is warm and dry.  Neurological:     Mental Status: He is alert.     Comments: No noted acute cognitive deficit. Sensation grossly intact to light touch in the extremities.   Grip strengths equal bilaterally.   Strength 5/5 in all extremities.  No gait disturbance.  Coordination intact.  Cranial nerves III-XII grossly intact.  Handles oral secretions without noted difficulty.  No noted phonation or speech deficit. No facial droop.   Psychiatric:        Mood and Affect: Mood and affect normal.        Speech: Speech normal.        Behavior: Behavior normal.     ED Results / Procedures / Treatments   Labs (all labs ordered are listed, but only abnormal results are displayed) Labs Reviewed  COMPREHENSIVE METABOLIC PANEL - Abnormal; Notable for the following components:      Result Value   Glucose, Bld 110 (*)    AST 62 (*)    ALT 100 (*)    All other components within normal limits  LIPASE, BLOOD  CBC WITH DIFFERENTIAL/PLATELET  HEPATITIS PANEL, ACUTE  TROPONIN I (HIGH SENSITIVITY)  TROPONIN I (HIGH SENSITIVITY)    EKG EKG Interpretation  Date/Time:  Monday May 23 2020 11:33:00 EST Ventricular Rate:  69 PR Interval:  140 QRS Duration: 88 QT Interval:  412 QTC Calculation: 441 R Axis:   85 Text Interpretation: Normal sinus rhythm Nonspecific ST abnormality Abnormal ECG no change Confirmed by 01-01-2003 [phone]) on 05/23/2020 1:55:48 PM   Radiology CT Angio Chest/Abd/Pel for Dissection W and/or  Wo Contrast  Result Date: 05/23/2020 CLINICAL DATA:  Abdominal pain, vomiting, chest pain, shortness of breath, aortic dissection suspected EXAM: CT  ANGIOGRAPHY CHEST, ABDOMEN AND PELVIS TECHNIQUE: Non-contrast CT of the chest was initially obtained. Multidetector CT imaging through the chest, abdomen and pelvis was performed using the standard protocol during bolus administration of intravenous contrast. Multiplanar reconstructed images and MIPs were obtained and reviewed to evaluate the vascular anatomy. CONTRAST:  OMNIPAQUE IOHEXOL 350 MG/ML SOLN COMPARISON:  CT chest angiogram, 05/18/2020 FINDINGS: CTA CHEST FINDINGS Cardiovascular: Preferential opacification of the thoracic aorta. Normal contour and caliber of the thoracic aorta. No evidence of aneurysm, dissection, or other acute aortic pathology. Aortic atherosclerosis. Normal heart size. Three-vessel coronary artery calcifications and/or stents. No pericardial effusion. Mediastinum/Nodes: No enlarged mediastinal, hilar, or axillary lymph nodes. Thyroid gland, trachea, and esophagus demonstrate no significant findings. Lungs/Pleura: Moderate to severe centrilobular emphysema. No pleural effusion or pneumothorax. Musculoskeletal: No chest wall abnormality. No acute or significant osseous findings. Status post bilateral shoulder total arthroplasty. Review of the MIP images confirms the above findings. CTA ABDOMEN AND PELVIS FINDINGS VASCULAR Normal contour and caliber of the abdominal aorta. No evidence of aneurysm, dissection, or other acute aortic pathology. Standard branching pattern of the abdominal aorta with solitary bilateral renal arteries. Moderate mixed atherosclerosis. Review of the MIP images confirms the above findings. NON-VASCULAR Hepatobiliary: No solid liver abnormality is seen. No gallstones, gallbladder wall thickening, or biliary dilatation. Pancreas: Unremarkable. No pancreatic ductal dilatation or surrounding inflammatory changes. Spleen: Normal in size without significant abnormality. Adrenals/Urinary Tract: Adrenal glands are unremarkable. Kidneys are normal, without renal  calculi, solid lesion, or hydronephrosis. Bladder is unremarkable. Stomach/Bowel: Stomach is within normal limits. Appendix appears normal. No evidence of bowel wall thickening, distention, or inflammatory changes. Lymphatic: No enlarged abdominal or pelvic lymph nodes. Reproductive: No mass or other significant abnormality. Other: No abdominal wall hernia or abnormality. No abdominopelvic ascites. Musculoskeletal: No acute or significant osseous findings. Status post bilateral hip total arthroplasty Review of the MIP images confirms the above findings. IMPRESSION: 1. Normal contour and caliber of the thoracic and abdominal aorta. No evidence of aneurysm, dissection, or other acute aortic pathology. 2.  Moderate mixed atherosclerosis. 3.  Coronary artery disease. 4.  Emphysema. Aortic Atherosclerosis (ICD10-I70.0) and Emphysema (ICD10-J43.9). Electronically Signed   By: Lauralyn Primes M.D.   On: 05/23/2020 14:43    Procedures Procedures (including critical care time)  Medications Ordered in ED Medications  iohexol (OMNIPAQUE) 350 MG/ML injection 100 mL (100 mLs Intravenous Contrast Given 05/23/20 1420)  morphine 4 MG/ML injection 4 mg (4 mg Intravenous Given 05/23/20 1509)  ondansetron (ZOFRAN) injection 4 mg (4 mg Intravenous Given 05/23/20 1509)  pantoprazole (PROTONIX) EC tablet 40 mg (40 mg Oral Given 05/23/20 1641)  famotidine (PEPCID) tablet 20 mg (20 mg Oral Given 05/23/20 1641)    ED Course  I have reviewed the triage vital signs and the nursing notes.  Pertinent labs & imaging results that were available during my care of the patient were reviewed by me and considered in my medical decision making (see chart for details).  Clinical Course as of May 23 1658  Mon May 23, 2020  1605 I discussed the lab and imaging results with the patient.  He now states thinking back he would now describe his previous pain as a burning sensation more than anything.  Currently pain-free.   [SJ]      Clinical Course User Index [SJ] Sharen Youngren C, PA-C  MDM Rules/Calculators/A&P                          Patient presents complaining of chest and abdominal pain. Patient is nontoxic appearing, afebrile, not tachycardic, not tachypneic, not hypotensive, maintains excellent SPO2 on room air, and is in no apparent distress.   I have reviewed the patient's chart to obtain more information.   I reviewed and interpreted the patient's labs and radiological studies. Delta troponins negative.  No improvement with nitroglycerin.  Patient states he had similar pain last week.  Negative CT PE study at that time. CT today without evidence of any acute abnormality. Mild elevation in AST and ALT.  Acute hepatitis panel pending at time of discharge. No leukocytosis.  No tenderness in the right upper quadrant through multiple exams.  The patient was given instructions for home care as well as return precautions. Patient voices understanding of these instructions, accepts the plan, and is comfortable with discharge.    Findings and plan of care discussed with Arby BarretteMarcy Pfeiffer, MD.   Vitals:   05/23/20 1431 05/23/20 1500 05/23/20 1530 05/23/20 1630  BP: 134/85 130/78 127/86 138/87  Pulse: 61 71 (!) 57 61  Resp: 16 12 16 11   Temp:      TempSrc:      SpO2: 100% 98% 100% 100%  Weight:      Height:         Final Clinical Impression(s) / ED Diagnoses Final diagnoses:  Nonspecific chest pain  Epigastric pain    Rx / DC Orders ED Discharge Orders         Ordered    pantoprazole (PROTONIX) 20 MG tablet  Daily        05/23/20 1630    famotidine (PEPCID) 20 MG tablet  2 times daily        05/23/20 1630    sucralfate (CARAFATE) 1 GM/10ML suspension  3 times daily with meals & bedtime        05/23/20 1630           Anselm PancoastJoy, Santo Zahradnik C, PA-C 05/23/20 1702    Arby BarrettePfeiffer, Marcy, MD 05/24/20 [phone]

## 2020-05-23 NOTE — ED Notes (Signed)
Review D/C papers with pt, pt states understanding, pt denies questions at this time. 

## 2020-05-23 NOTE — ED Notes (Signed)
Pt aware of need for urine  

## 2020-05-23 NOTE — Discharge Instructions (Addendum)
  Diet: Start with a clear liquid diet, progressed to a full liquid diet, and then bland solids as you are able. Please adhere to the enclosed dietary suggestions.  In general, avoid NSAIDs (i.e. ibuprofen, naproxen, etc.), caffeine, alcohol, spicy foods, fatty foods, or any other foods that seem to cause your symptoms to arise.  Protonix: Take this medication daily, 20-30 minutes prior to your first meal, for the next 8 weeks.  Continue to take this medication even if you begin to feel better.  Pepcid: Take this medication twice a day for the next 5 days.  Sucralfate: Sucralfate (generic for Carafate) is meant to soothe symptoms of abdominal discomfort and reflux.  Follow-up: Please follow-up with your primary care provider and gastroenterology on this matter. There was a mild abnormality in your AST and ALT, two of your liver labs.  Please follow-up with gastroenterology on this matter as well.  With any complaint of chest pain, it is always a good idea to communicate these instances with your cardiologist.  Return: Return to the ED for significantly worsening symptoms, persistent vomiting, persistent fever, vomiting blood, blood in the stools, dark stools, or any other major concerns.  For prescription assistance, may try using prescription discount sites or apps, such as goodrx.com

## 2020-06-29 ENCOUNTER — Other Ambulatory Visit (INDEPENDENT_AMBULATORY_CARE_PROVIDER_SITE_OTHER): Payer: Medicare Other

## 2020-06-29 ENCOUNTER — Encounter: Payer: Self-pay | Admitting: Gastroenterology

## 2020-06-29 ENCOUNTER — Other Ambulatory Visit: Payer: Self-pay

## 2020-06-29 ENCOUNTER — Ambulatory Visit (INDEPENDENT_AMBULATORY_CARE_PROVIDER_SITE_OTHER): Payer: Medicare Other | Admitting: Gastroenterology

## 2020-06-29 VITALS — BP 138/76 | HR 72 | Ht 73.0 in | Wt 187.0 lb

## 2020-06-29 DIAGNOSIS — Z800 Family history of malignant neoplasm of digestive organs: Secondary | ICD-10-CM | POA: Diagnosis not present

## 2020-06-29 DIAGNOSIS — R7401 Elevation of levels of liver transaminase levels: Secondary | ICD-10-CM

## 2020-06-29 DIAGNOSIS — R12 Heartburn: Secondary | ICD-10-CM

## 2020-06-29 DIAGNOSIS — R1013 Epigastric pain: Secondary | ICD-10-CM | POA: Diagnosis not present

## 2020-06-29 LAB — HEPATIC FUNCTION PANEL
ALT: 37 U/L (ref 0–53)
AST: 25 U/L (ref 0–37)
Albumin: 4 g/dL (ref 3.5–5.2)
Alkaline Phosphatase: 143 U/L — ABNORMAL HIGH (ref 39–117)
Bilirubin, Direct: 0.1 mg/dL (ref 0.0–0.3)
Total Bilirubin: 0.4 mg/dL (ref 0.2–1.2)
Total Protein: 7.2 g/dL (ref 6.0–8.3)

## 2020-06-29 NOTE — Patient Instructions (Signed)
If you are age 53 or older, your body mass index should be between 23-30. Your Body mass index is 24.67 kg/m. If this is out of the aforementioned range listed, please consider follow up with your Primary Care Provider.  If you are age 82 or younger, your body mass index should be between 19-25. Your Body mass index is 24.67 kg/m. If this is out of the aformentioned range listed, please consider follow up with your Primary Care Provider.   Please go to the second floor-[facility] [place] [address] and schedule your labwork today.  Stop pepcid in 2 weeks. If doing okay stop Carafate 2 weeks later. If doing okay decrease Protonix to 20mg  once daily. 2 weeks later if you are doing okay discontinue your Protonix.  Get a copy of prior lab from [facility] gastroenterology.  Due to recent changes in healthcare laws, you may see the results of your imaging and laboratory studies on MyChart before your provider has had a chance to review them.  We understand that in some cases there may be results that are confusing or concerning to you. Not all laboratory results come back in the same time frame and the provider may be waiting for multiple results in order to interpret others.  Please give [facility] 48 hours in order for your provider to thoroughly review all the results before contacting the office for clarification of your results.   Thank you for choosing me and [facility].  Vito Cirigliano, D.O.

## 2020-06-29 NOTE — Progress Notes (Signed)
Chief Complaint: GERD, abdominal pain   Referring Provider:    Lanae Boast, PA-C    HPI:     Christopher Nunez is a 54 y.o. male with a history of CAD s/p PCI with DES 2007 (ASA, Plavix), HTN, HLD, cutaneous lupus erythematosus (Plaquenil), GERD, depression, referred to the [facility]ology [facility] for evaluation of abdominal pain.  Was seen in the ER on 05/18/2020 with chest pressure, nausea, chills.  Positive D-dimer but negative CTA of the chest.  Negative troponins, and pain was presumed secondary to lupus and prescribed 5-day course of prednisone.  Return to the ER on 05/23/2020 with chest pressure.  No improvement with NTG.  CTA abdomen/pelvis unremarkable and prescribed omeprazole and Carafate and discharged home.  Was seen in follow-up by his PCM on 06/08/2020 with persistent abdominal pain.  Some improvement with Carafate, but ran out of medication and pain recurred.  Takes omeprazole 20 mg/day.  Aside from ASA 81 mg, does not take any NSAIDs.  At that appointment, changed to Protonix 40 mg/day, started Pepcid 20 mg BID, and refilled Carafate qid with lab check as below: -Normal iron panel -H/H 12.9/38.2, normal PLT WBC.  MCV/RDW 94/13.8 -Liver enzymes are repeated  Today, he states he has no prior history of GERD or similar sxs. Sxs now resolved. Good PO intake. No n/v. No hematochezia or melena throughout episode.  Taking all medications as prescribed.  No previous EGD. Colonoscopy about 5 years ago in [facility], and normal per patient with recommendation to repeat in 10 yers.    Father with a history of Colon Cancer, diagnosed >51 yo (diagnosed around age 1; deceased).  No other known family history of colon cancer, IBD, liver disease.  CBC Latest Ref Rng & Units 05/23/2020 05/18/2020 11/10/2014  WBC 4.0 - 10.5 K/uL 8.3 9.3 7.6  Hemoglobin 13.0 - 17.0 g/dL 55.7 32.2 12.2(L)  Hematocrit 39.0 - 52.0 % 39.0 40.4 36.7(L)  Platelets 150 - 400 K/uL 223 229  183    CMP Latest Ref Rng & Units 05/23/2020 05/18/2020 11/10/2014  Glucose 70 - 99 mg/dL 025(K) 270(W) 74  BUN 6 - 20 mg/dL 9 12 12   Creatinine 0.61 - 1.24 mg/dL 2.37 6.28  Sodium 135 - 145 mmol/L 135 136 137  Potassium 3.5 - 5.1 mmol/L 3.6 3.4(L) 3.3(L)  Chloride 98 - 111 mmol/L 101 101 104  CO2 22 - 32 mmol/L 24 23 21(L)  Calcium 8.9 - 10.3 mg/dL 8.9 9.0 8.9  Total Protein 6.5 - 8.1 g/dL 7.8 8.1 -  Total Bilirubin 0.3 - 1.2 mg/dL 0.5 0.8 -  Alkaline Phos 38 - 126 U/L 122 67 -  AST 15 - 41 U/L 62(H) 18 -  ALT 0 - 44 U/L 100(H) 17 -   -05/23/2020: Negative viral hepatitis panel  Past Medical History:  Diagnosis Date  . CAD S/P percutaneous coronary angioplasty    2007: PCI to RCA, 06/2014: 2.5 x 18 mm Resolute Integrity DES to RCA, for ISR per wife  . Essential hypertension    poorly controlled  . Hyperlipidemia with target LDL less than 70   . Lupus (systemic lupus erythematosus) (HCC)   . Myocardial infarction (HCC) 08/13/2005   Stent prob to RCA  . Unstable angina pectoris (HCC) 07/07/2014   RCA stent ISR -- PCI     Past Surgical History:  Procedure Laterality Date  . CARDIAC CATHETERIZATION N/A 11/09/2014   Procedure:  Left Heart Cath and Coronary Angiography;  Surgeon: Marykay Lex, MD;  Location: [facility] INVASIVE CV LAB CUPID;  Service: Cardiovascular;  Laterality: N/A;  . CORONARY ANGIOPLASTY WITH STENT PLACEMENT     2005 & 2015  . PERCUTANEOUS CORONARY STENT INTERVENTION (PCI-S)  11/09/2014   Procedure: Percutaneous Coronary Stent Intervention (Pci-S);  Surgeon: Marykay Lex, MD;  Location: [facility] INVASIVE CV LAB CUPID;  Service: Cardiovascular;;  . TOTAL HIP ARTHROPLASTY    . TOTAL SHOULDER REPLACEMENT     No family history on file. Social History   Tobacco Use  . Smoking status: Current Every Day Smoker    Packs/day: 0.50    Years: 30.00    Pack years: 15.00    Types: Cigarettes  . Smokeless tobacco: Never Used  Vaping Use  . Vaping Use: Never used   Substance Use Topics  . Alcohol use: Yes    Alcohol/week: 0.0 standard drinks    Comment: occasionally  . Drug use: No   Current Outpatient Medications  Medication Sig Dispense Refill  . amLODipine (NORVASC) 10 MG tablet Take 1 tablet (10 mg total) by mouth daily. 30 tablet 11  . aspirin EC 81 MG tablet Take 162 mg by mouth daily.    Marland Kitchen atorvastatin (LIPITOR) 80 MG tablet Take 80 mg by mouth daily.    . clobetasol cream (TEMOVATE) 0.05 % Apply 1 application topically daily. Apply to head for rash from lupus    . clopidogrel (PLAVIX) 75 MG tablet Take 75 mg by mouth daily.    . diclofenac Sodium (VOLTAREN) 1 % GEL Apply 4 g topically 4 (four) times daily.    . DULoxetine (CYMBALTA) 60 MG capsule Take 60 mg by mouth daily.    . famotidine (PEPCID) 20 MG tablet Take 1 tablet (20 mg total) by mouth 2 (two) times daily for 5 days. 10 tablet 0  . gabapentin (NEURONTIN) 600 MG tablet Take 600 mg by mouth 3 (three) times daily.    . hydrocortisone 2.5 % ointment Apply 1 application topically daily. Apply a thin layer to face for rash from lupus    . hydroxychloroquine (PLAQUENIL) 200 MG tablet Take 200 mg by mouth 2 (two) times daily.    Marland Kitchen lisinopril (PRINIVIL,ZESTRIL) 20 MG tablet Take 20 mg by mouth daily.    . metoprolol succinate (TOPROL-XL) 50 MG 24 hr tablet Take 50 mg by mouth daily. Take with or immediately following a meal.    . nitroGLYCERIN (NITROLINGUAL) 0.4 MG/SPRAY spray Place 1 spray under the tongue every 5 (five) minutes x 3 doses as needed for chest pain.    Marland Kitchen ondansetron (ZOFRAN ODT) 4 MG disintegrating tablet Take 1 tablet (4 mg total) by mouth every 8 (eight) hours as needed for nausea or vomiting. 20 tablet 0  . oxycodone (OXY-IR) 5 MG capsule Take 5 mg by mouth 3 (three) times daily. scheduled    . oxyCODONE-acetaminophen (PERCOCET/ROXICET) 5-325 MG tablet Take 1 tablet by mouth every 6 (six) hours as needed for severe pain. 15 tablet 0  . pantoprazole (PROTONIX) 20 MG tablet  Take 1 tablet (20 mg total) by mouth daily. 30 tablet 1  . pravastatin (PRAVACHOL) 40 MG tablet Take 40 mg by mouth daily.    . predniSONE (DELTASONE) 20 MG tablet Take 2 tablets (40 mg total) by mouth daily. 10 tablet 0  . sucralfate (CARAFATE) 1 GM/10ML suspension Take 10 mLs (1 g total) by mouth 4 (four) times daily -  with meals and  at bedtime. 420 mL 0  . tiZANidine (ZANAFLEX) 4 MG tablet Take 4 mg by mouth 3 (three) times daily.      No current facility-administered medications for this visit.   Allergies  Allergen Reactions  . Hydrocodone Itching  . Tramadol Itching     Review of Systems: All systems reviewed and negative except where noted in HPI.     Physical Exam:    Wt Readings from Last 3 Encounters:  05/23/20 191 lb (86.6 kg)  05/18/20 190 lb (86.2 kg)  11/10/14 175 lb 4.3 oz (79.5 kg)    There were no vitals taken for this visit. Constitutional:  Pleasant, in no acute distress. Psychiatric: Normal mood and affect. Behavior is normal. EENT: Pupils normal.  Conjunctivae are normal. No scleral icterus. Neck supple. No cervical LAD. Cardiovascular: Normal rate, regular rhythm. No edema Pulmonary/chest: Effort normal and breath sounds normal. No wheezing, rales or rhonchi. Abdominal: Soft, nondistended, nontender. Bowel sounds active throughout. There are no masses palpable. No hepatomegaly. Neurological: Alert and oriented to person place and time. Skin: Skin is warm and dry. No rashes noted.   ASSESSMENT AND PLAN;   1) Epigastric pain 2) Noncardiac chest pain 3) Heartburn  -Discussed full DDX today to include GERD, PUD, gastritis, etc. offered EGD to evaluate for mucosal/luminal pathology, and he politely declined owing to resolution of symptoms on current medical management.  Similarly declined H. pylori serologic testing. -Continue current medications for another 2 weeks, then plan to start taper, removing Pepcid first, and if doing okay, can discontinue  Carafate 2 weeks later, and if still doing okay, can reduce Protonix to 20 mg/day 2 weeks after that, with eventual plan to titrate off completely if no return of inciting symptoms -If return of index symptoms, plan for follow-up in the GI clinic to discuss ongoing long-term treatment strategies, and again review role of endoscopic evaluation  4) Elevated AST/ALT -Isolated elevation of AST/ALT 5 days after completely normal enzymes.  Otherwise normal T bili and ALP.  No prior known history of liver disease.  Suspect related to medications that he was taking for his above symptoms. -Repeat liver enzymes to ensure normalization  5) Family history of colon cancer -Father with colon cancer diagnosed ~age 77 -Obtain copy of previous colonoscopy report for review (patient reports this was done about 5 years ago) -Per current guidelines, could continue with 10-year screening intervals  RTC prn   Shellia Cleverly, DO, FACG  06/29/2020, 8:28 AM   Ananias Pilgrim, MD

## 2020-07-13 ENCOUNTER — Telehealth: Payer: Self-pay | Admitting: General Surgery

## 2020-07-13 DIAGNOSIS — R748 Abnormal levels of other serum enzymes: Secondary | ICD-10-CM

## 2020-07-13 NOTE — Telephone Encounter (Signed)
Spoke with Christopher Nunez regarding his lab results. The patient will have repeat Hepatic panel done in 6 weeks. Pt verbalized understanding. The patient will come to [facility] lab to have bloodwork

## 2020-07-13 NOTE — Telephone Encounter (Signed)
-----   Message from [facility] V, DO sent at 07/12/2020  1:17 PM EST ----- The AST/ALT have since normalized to 25/37 respectively.  Alkaline phosphatase though is now elevated at 143.  This was previously normal.  There are multiple sources for elevated alkaline phosphatase, to include nonhepatobiliary sources such as gastric/duodenal inflammation or even MSK pathology.  Plan for the following:  -Repeat liver enzymes along with GGT in 6 weeks.  If alkaline phosphatase again elevated, plan for alkaline phosphatase fractionization.

## 2020-07-13 NOTE — Telephone Encounter (Signed)
-----   Message from Vito Cirigliano V, DO sent at 07/12/2020  1:17 PM EST ----- The AST/ALT have since normalized to 25/37 respectively.  Alkaline phosphatase though is now elevated at 143.  This was previously normal.  There are multiple sources for elevated alkaline phosphatase, to include nonhepatobiliary sources such as gastric/duodenal inflammation or even MSK pathology.  Plan for the following:  -Repeat liver enzymes along with GGT in 6 weeks.  If alkaline phosphatase again elevated, plan for alkaline phosphatase fractionization.  

## 2021-06-09 ENCOUNTER — Emergency Department (HOSPITAL_BASED_OUTPATIENT_CLINIC_OR_DEPARTMENT_OTHER)
Admission: EM | Admit: 2021-06-09 | Discharge: 2021-06-09 | Disposition: A | Discharge status: Home / Self Care | Payer: Medicare Other | Attending: Emergency Medicine | Admitting: Emergency Medicine

## 2021-06-09 ENCOUNTER — Encounter (HOSPITAL_BASED_OUTPATIENT_CLINIC_OR_DEPARTMENT_OTHER): Payer: Self-pay | Admitting: Emergency Medicine

## 2021-06-09 ENCOUNTER — Other Ambulatory Visit: Payer: Self-pay

## 2021-06-09 ENCOUNTER — Emergency Department (HOSPITAL_BASED_OUTPATIENT_CLINIC_OR_DEPARTMENT_OTHER): Payer: Medicare Other

## 2021-06-09 DIAGNOSIS — Z96649 Presence of unspecified artificial hip joint: Secondary | ICD-10-CM | POA: Insufficient documentation

## 2021-06-09 DIAGNOSIS — F17210 Nicotine dependence, cigarettes, uncomplicated: Secondary | ICD-10-CM | POA: Diagnosis not present

## 2021-06-09 DIAGNOSIS — Z96619 Presence of unspecified artificial shoulder joint: Secondary | ICD-10-CM | POA: Insufficient documentation

## 2021-06-09 DIAGNOSIS — Z79899 Other long term (current) drug therapy: Secondary | ICD-10-CM | POA: Diagnosis not present

## 2021-06-09 DIAGNOSIS — R0789 Other chest pain: Secondary | ICD-10-CM | POA: Diagnosis present

## 2021-06-09 DIAGNOSIS — I10 Essential (primary) hypertension: Secondary | ICD-10-CM | POA: Diagnosis not present

## 2021-06-09 DIAGNOSIS — J101 Influenza due to other identified influenza virus with other respiratory manifestations: Secondary | ICD-10-CM | POA: Insufficient documentation

## 2021-06-09 DIAGNOSIS — I2510 Atherosclerotic heart disease of native coronary artery without angina pectoris: Secondary | ICD-10-CM | POA: Diagnosis not present

## 2021-06-09 DIAGNOSIS — J111 Influenza due to unidentified influenza virus with other respiratory manifestations: Secondary | ICD-10-CM

## 2021-06-09 DIAGNOSIS — Z20822 Contact with and (suspected) exposure to covid-19: Secondary | ICD-10-CM | POA: Insufficient documentation

## 2021-06-09 DIAGNOSIS — Z7982 Long term (current) use of aspirin: Secondary | ICD-10-CM | POA: Diagnosis not present

## 2021-06-09 LAB — CBC WITH DIFFERENTIAL/PLATELET
Abs Immature Granulocytes: 0.02 10*3/uL (ref 0.00–0.07)
Basophils Absolute: 0 10*3/uL (ref 0.0–0.1)
Basophils Relative: 0 %
Eosinophils Absolute: 0 10*3/uL (ref 0.0–0.5)
Eosinophils Relative: 0 %
HCT: 39.5 % (ref 39.0–52.0)
Hemoglobin: 13.8 g/dL (ref 13.0–17.0)
Immature Granulocytes: 0 %
Lymphocytes Relative: 15 %
Lymphs Abs: 1.4 10*3/uL (ref 0.7–4.0)
MCH: 30.6 pg (ref 26.0–34.0)
MCHC: 34.9 g/dL (ref 30.0–36.0)
MCV: 87.6 fL (ref 80.0–100.0)
Monocytes Absolute: 1 10*3/uL (ref 0.1–1.0)
Monocytes Relative: 11 %
Neutro Abs: 6.6 10*3/uL (ref 1.7–7.7)
Neutrophils Relative %: 74 %
Platelets: 192 10*3/uL (ref 150–400)
RBC: 4.51 MIL/uL (ref 4.22–5.81)
RDW: 14.3 % (ref 11.5–15.5)
WBC: 9 10*3/uL (ref 4.0–10.5)
nRBC: 0 % (ref 0.0–0.2)

## 2021-06-09 LAB — URINALYSIS, ROUTINE W REFLEX MICROSCOPIC
Bilirubin Urine: NEGATIVE
Glucose, UA: NEGATIVE mg/dL
Hgb urine dipstick: NEGATIVE
Ketones, ur: 40 mg/dL — AB
Leukocytes,Ua: NEGATIVE
Nitrite: NEGATIVE
Protein, ur: 30 mg/dL — AB
Specific Gravity, Urine: 1.025 (ref 1.005–1.030)
pH: 6 (ref 5.0–8.0)

## 2021-06-09 LAB — TROPONIN I (HIGH SENSITIVITY): Troponin I (High Sensitivity): 17 ng/L (ref <18)

## 2021-06-09 LAB — COMPREHENSIVE METABOLIC PANEL
ALT: 18 U/L (ref 0–44)
AST: 22 U/L (ref 15–41)
Albumin: 4.3 g/dL (ref 3.5–5.0)
Alkaline Phosphatase: 82 U/L (ref 38–126)
Anion gap: 10 (ref 5–15)
BUN: 13 mg/dL (ref 6–20)
CO2: 21 mmol/L — ABNORMAL LOW (ref 22–32)
Calcium: 8.9 mg/dL (ref 8.9–10.3)
Chloride: 99 mmol/L (ref 98–111)
Creatinine, Ser: 1.28 mg/dL — ABNORMAL HIGH (ref 0.61–1.24)
GFR, Estimated: >60 mL/min (ref >60)
Glucose, Bld: 109 mg/dL — ABNORMAL HIGH (ref 70–99)
Potassium: 3.7 mmol/L (ref 3.5–5.1)
Sodium: 130 mmol/L — ABNORMAL LOW (ref 135–145)
Total Bilirubin: 0.5 mg/dL (ref 0.3–1.2)
Total Protein: 8.5 g/dL — ABNORMAL HIGH (ref 6.5–8.1)

## 2021-06-09 LAB — URINALYSIS, MICROSCOPIC (REFLEX): Squamous Epithelial / HPF: NONE SEEN (ref 0–5)

## 2021-06-09 LAB — RESP PANEL BY RT-PCR (FLU A&B, COVID) ARPGX2
Influenza A by PCR: POSITIVE — AB
Influenza B by PCR: NEGATIVE
SARS Coronavirus 2 by RT PCR: NEGATIVE

## 2021-06-09 MED ORDER — LACTATED RINGERS IV BOLUS
1000.0000 mL | Freq: Once | INTRAVENOUS | Status: AC
  Administered 2021-06-09: 1000 mL via INTRAVENOUS

## 2021-06-09 MED ORDER — ACETAMINOPHEN 500 MG PO TABS
1000.0000 mg | ORAL_TABLET | Freq: Once | ORAL | Status: AC
  Administered 2021-06-09: 1000 mg via ORAL
  Filled 2021-06-09: qty 2

## 2021-06-09 MED ORDER — OSELTAMIVIR PHOSPHATE 75 MG PO CAPS: 75.0000 mg | ORAL_CAPSULE | Freq: Two times a day (BID) | ORAL | 0 refills | Status: AC

## 2021-06-09 NOTE — ED Triage Notes (Signed)
Pt states he has been having intermittent chest pain since yesterday morning  Pt states the pain is on the right side and describes it as pressure  Pt has hx of heart problems  Pt states he has chills and had some lightheadedness yesterday morning

## 2021-06-09 NOTE — ED Notes (Signed)
No acute distress noted upon this RN's departure of patient. Verified discharge paperwork with name and DOB. Vital signs stable. Patient taken to checkout window. Discharge paperwork discussed with patient and patients wife. No further questions voiced upon discharge.

## 2021-06-09 NOTE — ED Notes (Addendum)
Assumed care from [place]. Patient laying quietly on gurney. No acute distress noted. Patient updated on plan of care. Swab collected and patient sent to Xray.   4076: Patient unable to provide a urine sample at this time.   8088: Patient states he is feeling hot with chills - Temperature noted to be elevated. Md made aware for orders.   1103: Patient states he feels so much better. No acute distress noted.

## 2021-06-09 NOTE — ED Provider Notes (Signed)
[facility] EMERGENCY DEPARTMENT Provider Note   CSN: [id number] Arrival date & time: 06/09/21  0631     History Chief Complaint  Patient presents with   Chest Pain    Christopher Nunez is a 55 y.o. male.   Chest Pain Associated symptoms: cough   Associated symptoms: no abdominal pain, no back pain and no weakness   Patient presents with chest pain cough and myalgias.  States he is worried he has a cold.  Chills.  Lightheadedness.  Began yesterday.  Some right-sided chest tightness.  Does not really feel like previous MIs but states does have some similarities.  Patient states he was told he has fever.  Temperature of 99.6 upon arrival.  Has a cough with some occasional sputum production.  No known sick contacts.  States has been urinating more frequently also.  Patient is vaccinated for both flu and COVID.  No known sick contacts.  No abdominal pain.  Pain not changed with eating.    Past Medical History:  Diagnosis Date   CAD S/P percutaneous coronary angioplasty    2007: PCI to RCA, 06/2014: 2.5 x 18 mm Resolute Integrity DES to RCA, for ISR per wife   Essential hypertension    poorly controlled   Hyperlipidemia with target LDL less than 70    Lupus (systemic lupus erythematosus) (HCC)    Myocardial infarction (HCC) 08/13/2005   Stent prob to RCA   Unstable angina pectoris (HCC) 07/07/2014   RCA stent ISR -- PCI    Patient Active Problem List   Diagnosis Date Noted   CAD S/P percutaneous coronary angioplasty    Stenosis of coronary stent    NSTEMI (non-ST elevated myocardial infarction) (HCC) 11/08/2014   Hyperlipidemia with target LDL less than 70    Essential hypertension    Lupus (systemic lupus erythematosus) (HCC)     Past Surgical History:  Procedure Laterality Date   CARDIAC CATHETERIZATION N/A 11/09/2014   Procedure: Left Heart Cath and Coronary Angiography;  Surgeon: Marykay Lex, MD;  Location: [facility] INVASIVE CV LAB CUPID;  Service: Cardiovascular;   Laterality: N/A;   CORONARY ANGIOPLASTY WITH STENT PLACEMENT     2005 & 2015   PERCUTANEOUS CORONARY STENT INTERVENTION (PCI-S)  11/09/2014   Procedure: Percutaneous Coronary Stent Intervention (Pci-S);  Surgeon: Marykay Lex, MD;  Location: [facility] INVASIVE CV LAB CUPID;  Service: Cardiovascular;;   TOTAL HIP ARTHROPLASTY     TOTAL SHOULDER REPLACEMENT         Family History  Problem Relation Age of Onset   Colon cancer Father     Social History   Tobacco Use   Smoking status: Every Day    Packs/day: 0.50    Years: 30.00    Pack years: 15.00    Types: Cigarettes   Smokeless tobacco: Never  Vaping Use   Vaping Use: Never used  Substance Use Topics   Alcohol use: Not Currently    Comment: occasionally   Drug use: No    Home Medications Prior to Admission medications   Medication Sig Start Date End Date Taking? Authorizing Provider  oseltamivir (TAMIFLU) 75 MG capsule Take 1 capsule (75 mg total) by mouth every 12 (twelve) hours. 06/09/21  Yes Benjiman Core, MD  amLODipine (NORVASC) 10 MG tablet Take 1 tablet (10 mg total) by mouth daily. 11/10/14   Dwana Melena, PA-C  aspirin EC 81 MG tablet Take 162 mg by mouth daily.    [provider]  atorvastatin (  LIPITOR) 80 MG tablet Take 80 mg by mouth daily. 05/09/20   [provider]  clobetasol cream (TEMOVATE) 0.05 % Apply 1 application topically daily. Apply to head for rash from lupus    [provider]  clopidogrel (PLAVIX) 75 MG tablet Take 75 mg by mouth daily.    [provider]  diclofenac Sodium (VOLTAREN) 1 % GEL Apply 4 g topically 4 (four) times daily. 05/04/20   [provider]  DULoxetine (CYMBALTA) 60 MG capsule Take 60 mg by mouth daily.    [provider]  famotidine (PEPCID) 20 MG tablet Take 1 tablet (20 mg total) by mouth 2 (two) times daily for 5 days. 05/23/20 05/28/20  Joy, Shawn C, PA-C  gabapentin (NEURONTIN) 600 MG tablet Take 600 mg by mouth 3 (three)  times daily.    [provider]  hydrocortisone 2.5 % ointment Apply 1 application topically daily. Apply a thin layer to face for rash from lupus    [provider]  hydroxychloroquine (PLAQUENIL) 200 MG tablet Take 200 mg by mouth 2 (two) times daily.    [provider]  lisinopril (PRINIVIL,ZESTRIL) 20 MG tablet Take 20 mg by mouth daily.    [provider]  metoprolol succinate (TOPROL-XL) 50 MG 24 hr tablet Take 50 mg by mouth daily. Take with or immediately following a meal.    [provider]  nitroGLYCERIN (NITROLINGUAL) 0.4 MG/SPRAY spray Place 1 spray under the tongue every 5 (five) minutes x 3 doses as needed for chest pain.    [provider]  ondansetron (ZOFRAN ODT) 4 MG disintegrating tablet Take 1 tablet (4 mg total) by mouth every 8 (eight) hours as needed for nausea or vomiting. 05/18/20   Gwyneth Sprout, MD  oxycodone (OXY-IR) 5 MG capsule Take 5 mg by mouth 3 (three) times daily. scheduled    [provider]  oxyCODONE-acetaminophen (PERCOCET/ROXICET) 5-325 MG tablet Take 1 tablet by mouth every 6 (six) hours as needed for severe pain. 05/18/20   Gwyneth Sprout, MD  pantoprazole (PROTONIX) 20 MG tablet Take 1 tablet (20 mg total) by mouth daily. 05/23/20 07/22/20  Joy, Shawn C, PA-C  pravastatin (PRAVACHOL) 40 MG tablet Take 40 mg by mouth daily.    [provider]  predniSONE (DELTASONE) 20 MG tablet Take 2 tablets (40 mg total) by mouth daily. 05/18/20   Gwyneth Sprout, MD  sucralfate (CARAFATE) 1 GM/10ML suspension Take 10 mLs (1 g total) by mouth 4 (four) times daily -  with meals and at bedtime. 05/23/20   Joy, Shawn C, PA-C  tiZANidine (ZANAFLEX) 4 MG tablet Take 4 mg by mouth 3 (three) times daily.    [provider]    Allergies    Hydrocodone and Tramadol  Review of Systems   Review of Systems  Constitutional:  Positive for appetite change and chills.  HENT:  Positive for  congestion.   Respiratory:  Positive for cough.   Cardiovascular:  Positive for chest pain. Negative for leg swelling.  Gastrointestinal:  Negative for abdominal pain.  Genitourinary:  Negative for flank pain.  Musculoskeletal:  Negative for back pain.  Skin:  Negative for rash.  Neurological:  Negative for weakness.  Psychiatric/Behavioral:  Negative for confusion.    Physical Exam Updated Vital Signs BP 140/84   Pulse 85   Temp 99.5 F (37.5 C) (Oral)   Resp 16   Ht 6\' 1"  (1.854 m)   Wt 91.7 kg   SpO2 98%  BMI 26.68 kg/m   Physical Exam Vitals and nursing note reviewed.  Constitutional:      Appearance: He is well-developed.  HENT:     Head: Atraumatic.  Cardiovascular:     Rate and Rhythm: Normal rate and regular rhythm.  Pulmonary:     Breath sounds: No wheezing.  Chest:     Chest wall: No tenderness.  Abdominal:     Palpations: There is no mass.     Tenderness: There is no abdominal tenderness.  Musculoskeletal:     Cervical back: Neck supple.     Right lower leg: No edema.     Left lower leg: No edema.  Skin:    General: Skin is warm.     Capillary Refill: Capillary refill takes less than 2 seconds.    ED Results / Procedures / Treatments   Labs (all labs ordered are listed, but only abnormal results are displayed) Labs Reviewed  RESP PANEL BY RT-PCR (FLU A&B, COVID) ARPGX2 - Abnormal; Notable for the following components:      Result Value   Influenza A by PCR POSITIVE (*)    All other components within normal limits  COMPREHENSIVE METABOLIC PANEL - Abnormal; Notable for the following components:   Sodium 130 (*)    CO2 21 (*)    Glucose, Bld 109 (*)    Creatinine, Ser 1.28 (*)    Total Protein 8.5 (*)    All other components within normal limits  URINALYSIS, ROUTINE W REFLEX MICROSCOPIC - Abnormal; Notable for the following components:   Ketones, ur 40 (*)    Protein, ur 30 (*)    All other components within normal limits  URINALYSIS,  MICROSCOPIC (REFLEX) - Abnormal; Notable for the following components:   Bacteria, UA FEW (*)    All other components within normal limits  CBC WITH DIFFERENTIAL/PLATELET  TROPONIN I (HIGH SENSITIVITY)    EKG EKG Interpretation  Date/Time:  Friday June 09 2021 06:42:10 EST Ventricular Rate:  81 PR Interval:  150 QRS Duration: 95 QT Interval:  355 QTC Calculation: 412 R Axis:   87 Text Interpretation: Sinus rhythm Probable left atrial enlargement Minimal ST depression, diffuse leads Confirmed by Benjiman Core [phone]) on 06/09/2021 7:00:31 AM  Radiology DG Chest 2 View  Result Date: 06/09/2021 CLINICAL DATA:  55 year old male with chest pain since last night. Smoker. EXAM: CHEST - 2 VIEW COMPARISON:  Chest radiographs 05/18/2020 and earlier. FINDINGS: PA and lateral views. Chronic bilateral shoulder arthroplasty. Lung volumes and mediastinal contours are within normal limits. Visualized tracheal air column is within normal limits. Mild diffuse increased interstitial markings appear chronic, likely smoking related. Otherwise both lungs appear clear. No pneumothorax or pleural effusion. No acute osseous abnormality identified. Negative visible bowel gas. IMPRESSION: No acute cardiopulmonary abnormality. Electronically Signed   By: Odessa Fleming M.D.   On: 06/09/2021 07:47    Procedures Procedures   Medications Ordered in ED Medications  acetaminophen (TYLENOL) tablet 1,000 mg (1,000 mg Oral Given 06/09/21 0805)  lactated ringers bolus 1,000 mL (0 mLs Intravenous Stopped 06/09/21 0948)    ED Course  I have reviewed the triage vital signs and the nursing notes.  Pertinent labs & imaging results that were available during my care of the patient were reviewed by me and considered in my medical decision making (see chart for details).    MDM Rules/Calculators/A&P  Patient with chills cough.  Some sputum production.  Chest x-ray reassuring.  Does have mild  hyponatremia.  Flu test positive however.  With chest tightness and previous cardiac history troponin done and reassuring.  EKG also reassuring.  Did have some mild hypoxia with sleeping.  Reported history of same.  Is on CPAP at night.  Will discharge home.  Well-appearing overall Final Clinical Impression(s) / ED Diagnoses Final diagnoses:  Chest discomfort  Influenza    Rx / DC Orders ED Discharge Orders          Ordered    oseltamivir (TAMIFLU) 75 MG capsule  Every 12 hours        06/09/21 1037             Benjiman Core, MD 06/09/21 1042

## 2021-06-09 NOTE — Discharge Instructions (Signed)
Your sodium was mildly low today.  Follow-up with your primary care doctor for it.  Try and keep her self hydrated

## 2021-12-14 ENCOUNTER — Other Ambulatory Visit: Payer: Self-pay

## 2021-12-14 ENCOUNTER — Emergency Department (HOSPITAL_BASED_OUTPATIENT_CLINIC_OR_DEPARTMENT_OTHER)
Admission: EM | Admit: 2021-12-14 | Discharge: 2021-12-14 | Disposition: A | Discharge status: Home / Self Care | Payer: Medicare Other | Attending: Emergency Medicine | Admitting: Emergency Medicine

## 2021-12-14 ENCOUNTER — Encounter (HOSPITAL_BASED_OUTPATIENT_CLINIC_OR_DEPARTMENT_OTHER): Payer: Self-pay

## 2021-12-14 ENCOUNTER — Emergency Department (HOSPITAL_BASED_OUTPATIENT_CLINIC_OR_DEPARTMENT_OTHER): Payer: Medicare Other

## 2021-12-14 DIAGNOSIS — S4991XA Unspecified injury of right shoulder and upper arm, initial encounter: Secondary | ICD-10-CM | POA: Diagnosis present

## 2021-12-14 DIAGNOSIS — Z96612 Presence of left artificial shoulder joint: Secondary | ICD-10-CM | POA: Insufficient documentation

## 2021-12-14 DIAGNOSIS — Z7982 Long term (current) use of aspirin: Secondary | ICD-10-CM | POA: Insufficient documentation

## 2021-12-14 DIAGNOSIS — S46911A Strain of unspecified muscle, fascia and tendon at shoulder and upper arm level, right arm, initial encounter: Secondary | ICD-10-CM | POA: Insufficient documentation

## 2021-12-14 DIAGNOSIS — X501XXA Overexertion from prolonged static or awkward postures, initial encounter: Secondary | ICD-10-CM | POA: Diagnosis not present

## 2021-12-14 DIAGNOSIS — Z96611 Presence of right artificial shoulder joint: Secondary | ICD-10-CM | POA: Diagnosis not present

## 2021-12-14 DIAGNOSIS — M542 Cervicalgia: Secondary | ICD-10-CM | POA: Diagnosis not present

## 2021-12-14 DIAGNOSIS — I2510 Atherosclerotic heart disease of native coronary artery without angina pectoris: Secondary | ICD-10-CM | POA: Diagnosis not present

## 2021-12-14 DIAGNOSIS — R0789 Other chest pain: Secondary | ICD-10-CM | POA: Insufficient documentation

## 2021-12-14 LAB — BASIC METABOLIC PANEL
Anion gap: 8 (ref 5–15)
BUN: 13 mg/dL (ref 6–20)
CO2: 22 mmol/L (ref 22–32)
Calcium: 8.9 mg/dL (ref 8.9–10.3)
Chloride: 106 mmol/L (ref 98–111)
Creatinine, Ser: 1.15 mg/dL (ref 0.61–1.24)
GFR, Estimated: >60 mL/min (ref >60)
Glucose, Bld: 123 mg/dL — ABNORMAL HIGH (ref 70–99)
Potassium: 3.7 mmol/L (ref 3.5–5.1)
Sodium: 136 mmol/L (ref 135–145)

## 2021-12-14 LAB — CBC
HCT: 38.9 % — ABNORMAL LOW (ref 39.0–52.0)
Hemoglobin: 13.7 g/dL (ref 13.0–17.0)
MCH: 31.9 pg (ref 26.0–34.0)
MCHC: 35.2 g/dL (ref 30.0–36.0)
MCV: 90.7 fL (ref 80.0–100.0)
Platelets: 207 10*3/uL (ref 150–400)
RBC: 4.29 MIL/uL (ref 4.22–5.81)
RDW: 13.5 % (ref 11.5–15.5)
WBC: 8.8 10*3/uL (ref 4.0–10.5)
nRBC: 0 % (ref 0.0–0.2)

## 2021-12-14 LAB — TROPONIN I (HIGH SENSITIVITY): Troponin I (High Sensitivity): 9 ng/L (ref <18)

## 2021-12-14 MED ORDER — HYDROMORPHONE HCL 1 MG/ML IJ SOLN
1.0000 mg | Freq: Once | INTRAMUSCULAR | Status: AC
  Administered 2021-12-14: 1 mg via INTRAVENOUS
  Filled 2021-12-14: qty 1

## 2021-12-14 MED ORDER — OXYCODONE-ACETAMINOPHEN 5-325 MG PO TABS: 1.0000 | ORAL_TABLET | Freq: Four times a day (QID) | ORAL | 0 refills | Status: AC | PRN

## 2021-12-14 MED ORDER — DIAZEPAM 5 MG/ML IJ SOLN
5.0000 mg | Freq: Once | INTRAMUSCULAR | Status: AC
  Administered 2021-12-14: 5 mg via INTRAVENOUS
  Filled 2021-12-14: qty 2

## 2021-12-14 NOTE — ED Provider Notes (Signed)
[facility] EMERGENCY DEPARTMENT Provider Note   CSN: [id number] Arrival date & time: 12/14/21  1840     History  Chief Complaint  Patient presents with   Shoulder Injury   Torticollis   Chest Pain    Christopher Nunez is a 56 y.o. male history of bilateral shoulder replacements, CAD with stents here presenting with right shoulder pain and neck pain and chest pain.  Patient states that he did pick up something yesterday and today started having severe right shoulder pain.  Denies any trauma or injury.  States that pain radiate to the neck and right side of the chest. Has hx of CAD but states that this is different than previous heart attacks. He is not on pain meds at baseline   The history is provided by the patient.       Home Medications Prior to Admission medications   Medication Sig Start Date End Date Taking? Authorizing Provider  amLODipine (NORVASC) 10 MG tablet Take 1 tablet (10 mg total) by mouth daily. 11/10/14   Dwana Melena, PA-C  aspirin EC 81 MG tablet Take 162 mg by mouth daily.    [provider]  atorvastatin (LIPITOR) 80 MG tablet Take 80 mg by mouth daily. 05/09/20   [provider]  clobetasol cream (TEMOVATE) 0.05 % Apply 1 application topically daily. Apply to head for rash from lupus    [provider]  clopidogrel (PLAVIX) 75 MG tablet Take 75 mg by mouth daily.    [provider]  diclofenac Sodium (VOLTAREN) 1 % GEL Apply 4 g topically 4 (four) times daily. 05/04/20   [provider]  DULoxetine (CYMBALTA) 60 MG capsule Take 60 mg by mouth daily.    [provider]  famotidine (PEPCID) 20 MG tablet Take 1 tablet (20 mg total) by mouth 2 (two) times daily for 5 days. 05/23/20 05/28/20  Joy, Shawn C, PA-C  gabapentin (NEURONTIN) 600 MG tablet Take 600 mg by mouth 3 (three) times daily.    [provider]  hydrocortisone 2.5 % ointment Apply 1 application topically daily. Apply a thin layer  to face for rash from lupus    [provider]  hydroxychloroquine (PLAQUENIL) 200 MG tablet Take 200 mg by mouth 2 (two) times daily.    [provider]  lisinopril (PRINIVIL,ZESTRIL) 20 MG tablet Take 20 mg by mouth daily.    [provider]  metoprolol succinate (TOPROL-XL) 50 MG 24 hr tablet Take 50 mg by mouth daily. Take with or immediately following a meal.    [provider]  nitroGLYCERIN (NITROLINGUAL) 0.4 MG/SPRAY spray Place 1 spray under the tongue every 5 (five) minutes x 3 doses as needed for chest pain.    [provider]  ondansetron (ZOFRAN ODT) 4 MG disintegrating tablet Take 1 tablet (4 mg total) by mouth every 8 (eight) hours as needed for nausea or vomiting. 05/18/20   Gwyneth Sprout, MD  oseltamivir (TAMIFLU) 75 MG capsule Take 1 capsule (75 mg total) by mouth every 12 (twelve) hours. 06/09/21   Benjiman Core, MD  oxycodone (OXY-IR) 5 MG capsule Take 5 mg by mouth 3 (three) times daily. scheduled    [provider]  oxyCODONE-acetaminophen (PERCOCET/ROXICET) 5-325 MG tablet Take 1 tablet by mouth every 6 (six) hours as needed for severe pain. 05/18/20   Gwyneth Sprout, MD  pantoprazole (PROTONIX) 20 MG tablet Take 1 tablet (20 mg total) by mouth daily. 05/23/20 07/22/20  Joy, Hillard Danker,  PA-C  pravastatin (PRAVACHOL) 40 MG tablet Take 40 mg by mouth daily.    [provider]  predniSONE (DELTASONE) 20 MG tablet Take 2 tablets (40 mg total) by mouth daily. 05/18/20   Gwyneth SproutPlunkett, Whitney, MD  sucralfate (CARAFATE) 1 GM/10ML suspension Take 10 mLs (1 g total) by mouth 4 (four) times daily -  with meals and at bedtime. 05/23/20   Joy, Shawn C, PA-C  tiZANidine (ZANAFLEX) 4 MG tablet Take 4 mg by mouth 3 (three) times daily.    [provider]      Allergies    Hydrocodone and Tramadol    Review of Systems   Review of Systems  All other systems reviewed and are negative.   Physical Exam Updated Vital  Signs BP (!) 151/92   Pulse 76   Temp 97.6 F (36.4 C) (Oral)   Resp 18   Ht 6\' 1"  (1.854 m)   Wt 93 kg   SpO2 96%   BMI 27.05 kg/m  Physical Exam Vitals and nursing note reviewed.  Constitutional:      Comments: Uncomfortable   HENT:     Head: Normocephalic.  Eyes:     Extraocular Movements: Extraocular movements intact.     Pupils: Pupils are equal, round, and reactive to light.  Cardiovascular:     Rate and Rhythm: Normal rate and regular rhythm.     Heart sounds: Normal heart sounds.  Pulmonary:     Effort: Pulmonary effort is normal.     Breath sounds: Normal breath sounds.  Abdominal:     General: Bowel sounds are normal.     Palpations: Abdomen is soft.  Musculoskeletal:     Cervical back: Normal range of motion and neck supple.     Comments: Mild tenderness over the right AC joint, patient is able to range the right shoulder.  Skin:    General: Skin is warm.     Capillary Refill: Capillary refill takes less than 2 seconds.  Neurological:     General: No focal deficit present.     Mental Status: He is alert and oriented to person, place, and time.  Psychiatric:        Mood and Affect: Mood normal.        Behavior: Behavior normal.     ED Results / Procedures / Treatments   Labs (all labs ordered are listed, but only abnormal results are displayed) Labs Reviewed  BASIC METABOLIC PANEL - Abnormal; Notable for the following components:      Result Value   Glucose, Bld 123 (*)    All other components within normal limits  CBC - Abnormal; Notable for the following components:   HCT 38.9 (*)    All other components within normal limits  TROPONIN I (HIGH SENSITIVITY)  TROPONIN I (HIGH SENSITIVITY)    EKG EKG Interpretation  Date/Time:  Thursday December 14 2021 18:53:01 EDT Ventricular Rate:  69 PR Interval:  152 QRS Duration: 88 QT Interval:  412 QTC Calculation: 441 R Axis:   83 Text Interpretation: Normal sinus rhythm Nonspecific ST abnormality  Abnormal ECG When compared with ECG of 09-Jun-2021 06:42, PREVIOUS ECG IS PRESENT Confirmed by Richardean CanalYao, Juliette Standre H [phone]([id number]) on 12/14/2021 8:43:05 PM  Radiology DG Chest 2 View  Result Date: 12/14/2021 CLINICAL DATA:  Atraumatic right shoulder pain. EXAM: CHEST - 2 VIEW COMPARISON:  June 09, 2021 FINDINGS: The lateral view is limited in evaluation secondary to positioning of the patient's upper extremity. The heart size  and mediastinal contours are within normal limits. Both lungs are clear. An intact right shoulder replacement is seen. There is mild dextroscoliosis of the lower thoracic and upper lumbar spine. IMPRESSION: No active cardiopulmonary disease. Electronically Signed   By: Aram Candela M.D.   On: 12/14/2021 19:24   DG Shoulder Right  Result Date: 12/14/2021 CLINICAL DATA:  Atraumatic right shoulder pain. EXAM: RIGHT SHOULDER - 2+ VIEW COMPARISON:  None Available. FINDINGS: Intact right shoulder replacement is seen. There is no evidence of fracture or dislocation. Mild to moderate severity degenerative changes are seen involving the right acromioclavicular joint. Soft tissues are unremarkable. IMPRESSION: Intact right shoulder replacement without evidence of acute osseous abnormality. Electronically Signed   By: Aram Candela M.D.   On: 12/14/2021 19:23    Procedures Procedures    Medications Ordered in ED Medications  HYDROmorphone (DILAUDID) injection 1 mg (1 mg Intravenous Given 12/14/21 2019)  diazepam (VALIUM) injection 5 mg (5 mg Intravenous Given 12/14/21 2016)    ED Course/ Medical Decision Making/ A&P                           Medical Decision Making Zimir Kittleson is a 56 y.o. male here presenting with right shoulder pain.  Patient has right shoulder replacement.  Patient did pick up something yesterday has worsening pain.  I think likely muscle strain.  Patient does have CAD with stents but pain is on the right side and this is a very atypical for ACS.  Plan to get right  shoulder x-ray and CBC and BMP and troponin x1.  Will give pain medicine muscle relaxant and reassess  8:41 PM I reviewed patient's labs and independently reviewed his imaging.  Patient's chest x-ray and right shoulder x-ray were unremarkable.  His shoulder replacement is intact.  His troponin is normal.  Given pain medicine and felt better.  At this point, I think likely muscle strain.  He has tizanidine at home.  Will discharge home with pain medicine.   Problems Addressed: Shoulder strain, right, initial encounter: acute illness or injury  Amount and/or Complexity of Data Reviewed Labs: ordered. Decision-making details documented in ED Course. Radiology: ordered and independent interpretation performed. Decision-making details documented in ED Course. ECG/medicine tests: ordered and independent interpretation performed. Decision-making details documented in ED Course.  Risk Prescription drug management.    Final Clinical Impression(s) / ED Diagnoses Final diagnoses:  Shoulder strain, right, initial encounter    Rx / DC Orders ED Discharge Orders     None         Charlynne Pander, MD 12/14/21 2043

## 2021-12-14 NOTE — Discharge Instructions (Signed)
You likely have a shoulder strain.  Your x-rays did not show any fracture  Continue taking your tizanidine as prescribed by your doctor.  Please take Percocet for severe pain.  You should follow-up with your orthopedic doctor  Avoid any heavy lifting  Return to ER if you have worse shoulder pain, chest pain

## 2021-12-14 NOTE — ED Triage Notes (Signed)
Right shoulder pain radiating up to neck and into chest x 2 days.

## 2024-01-13 IMAGING — DX DG CHEST 2V
2 series · 2 of 2 positions shown · non-contrast
Comparison: June 09, 2021

CLINICAL DATA: Atraumatic right shoulder pain.

EXAM:
CHEST - 2 VIEW

[chest pa]
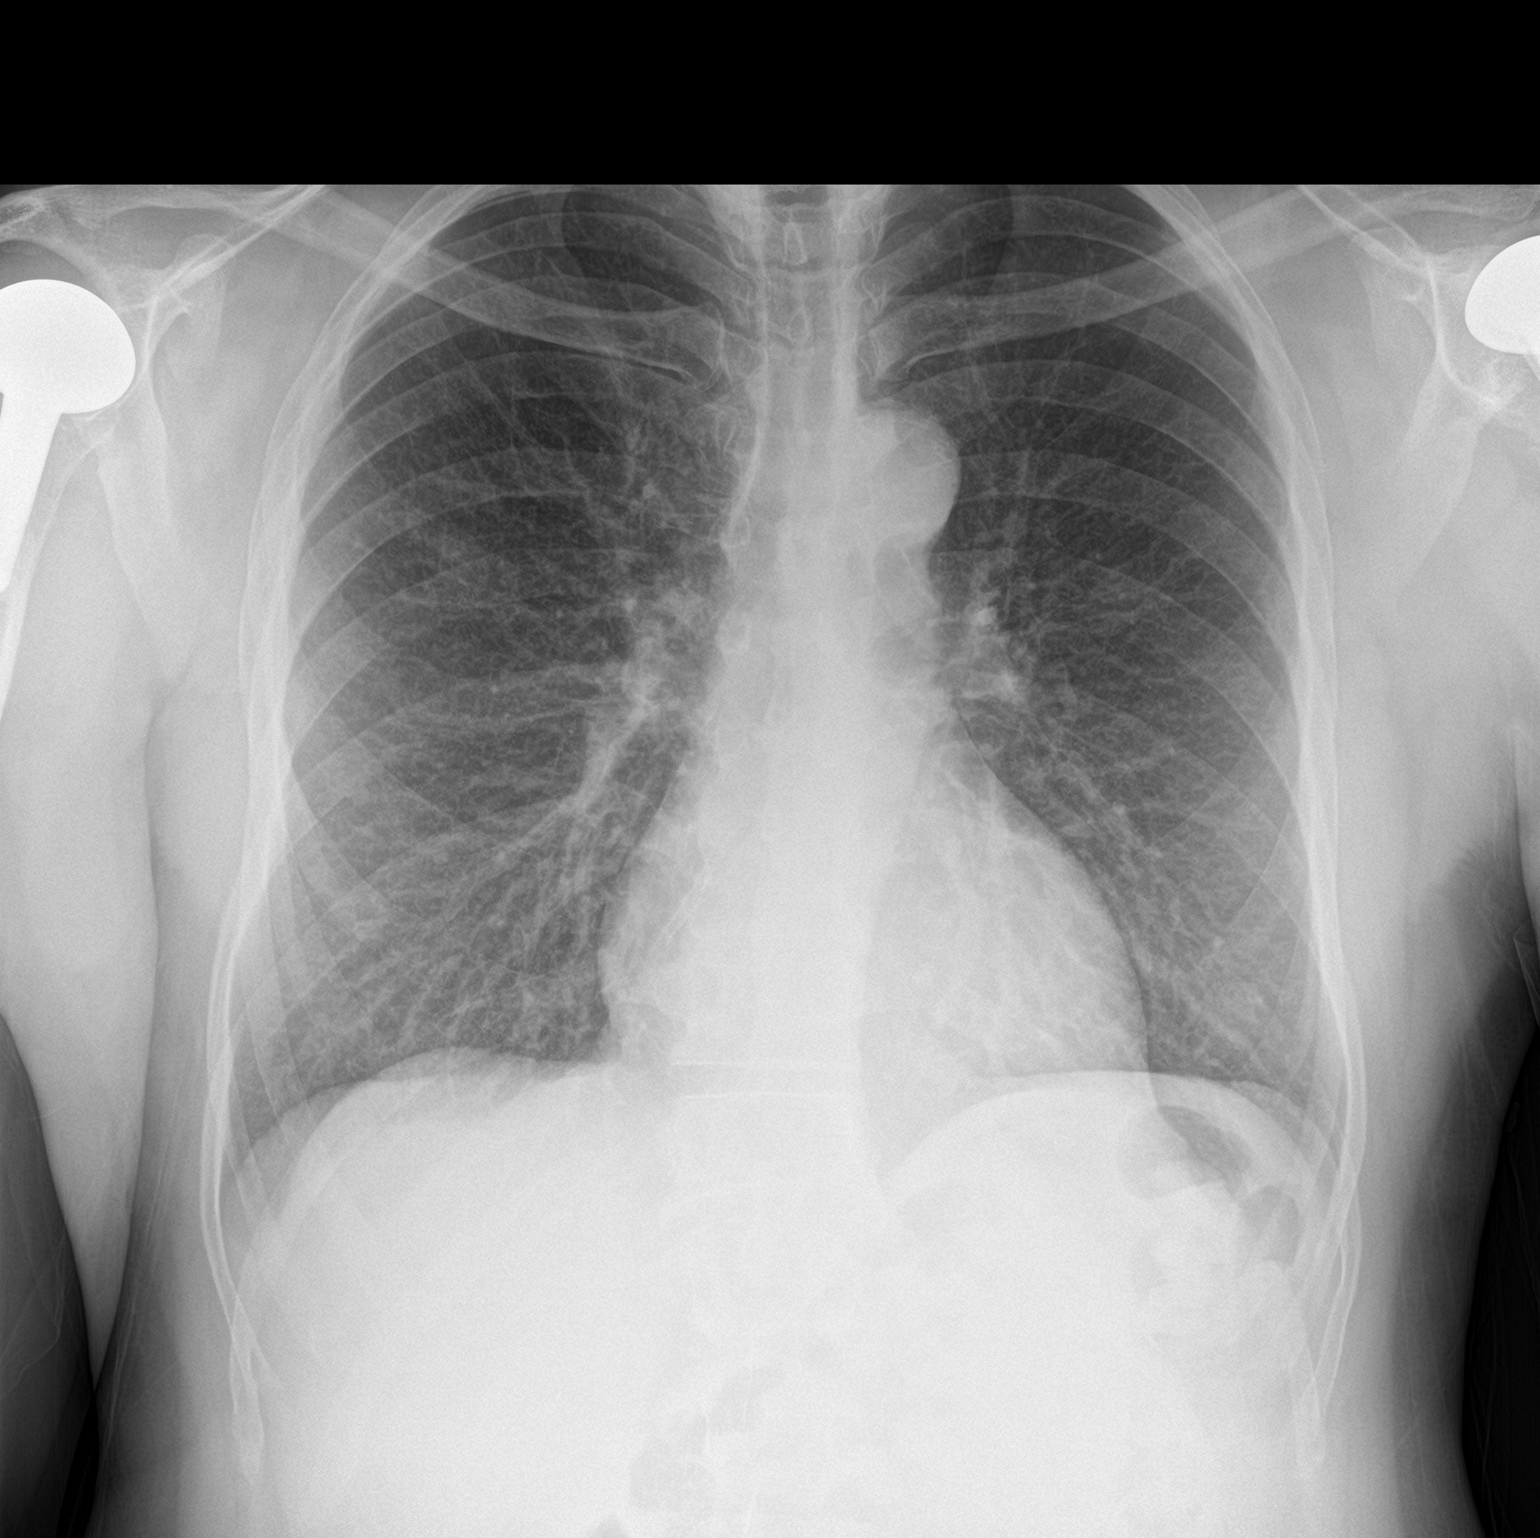

[chest lat]
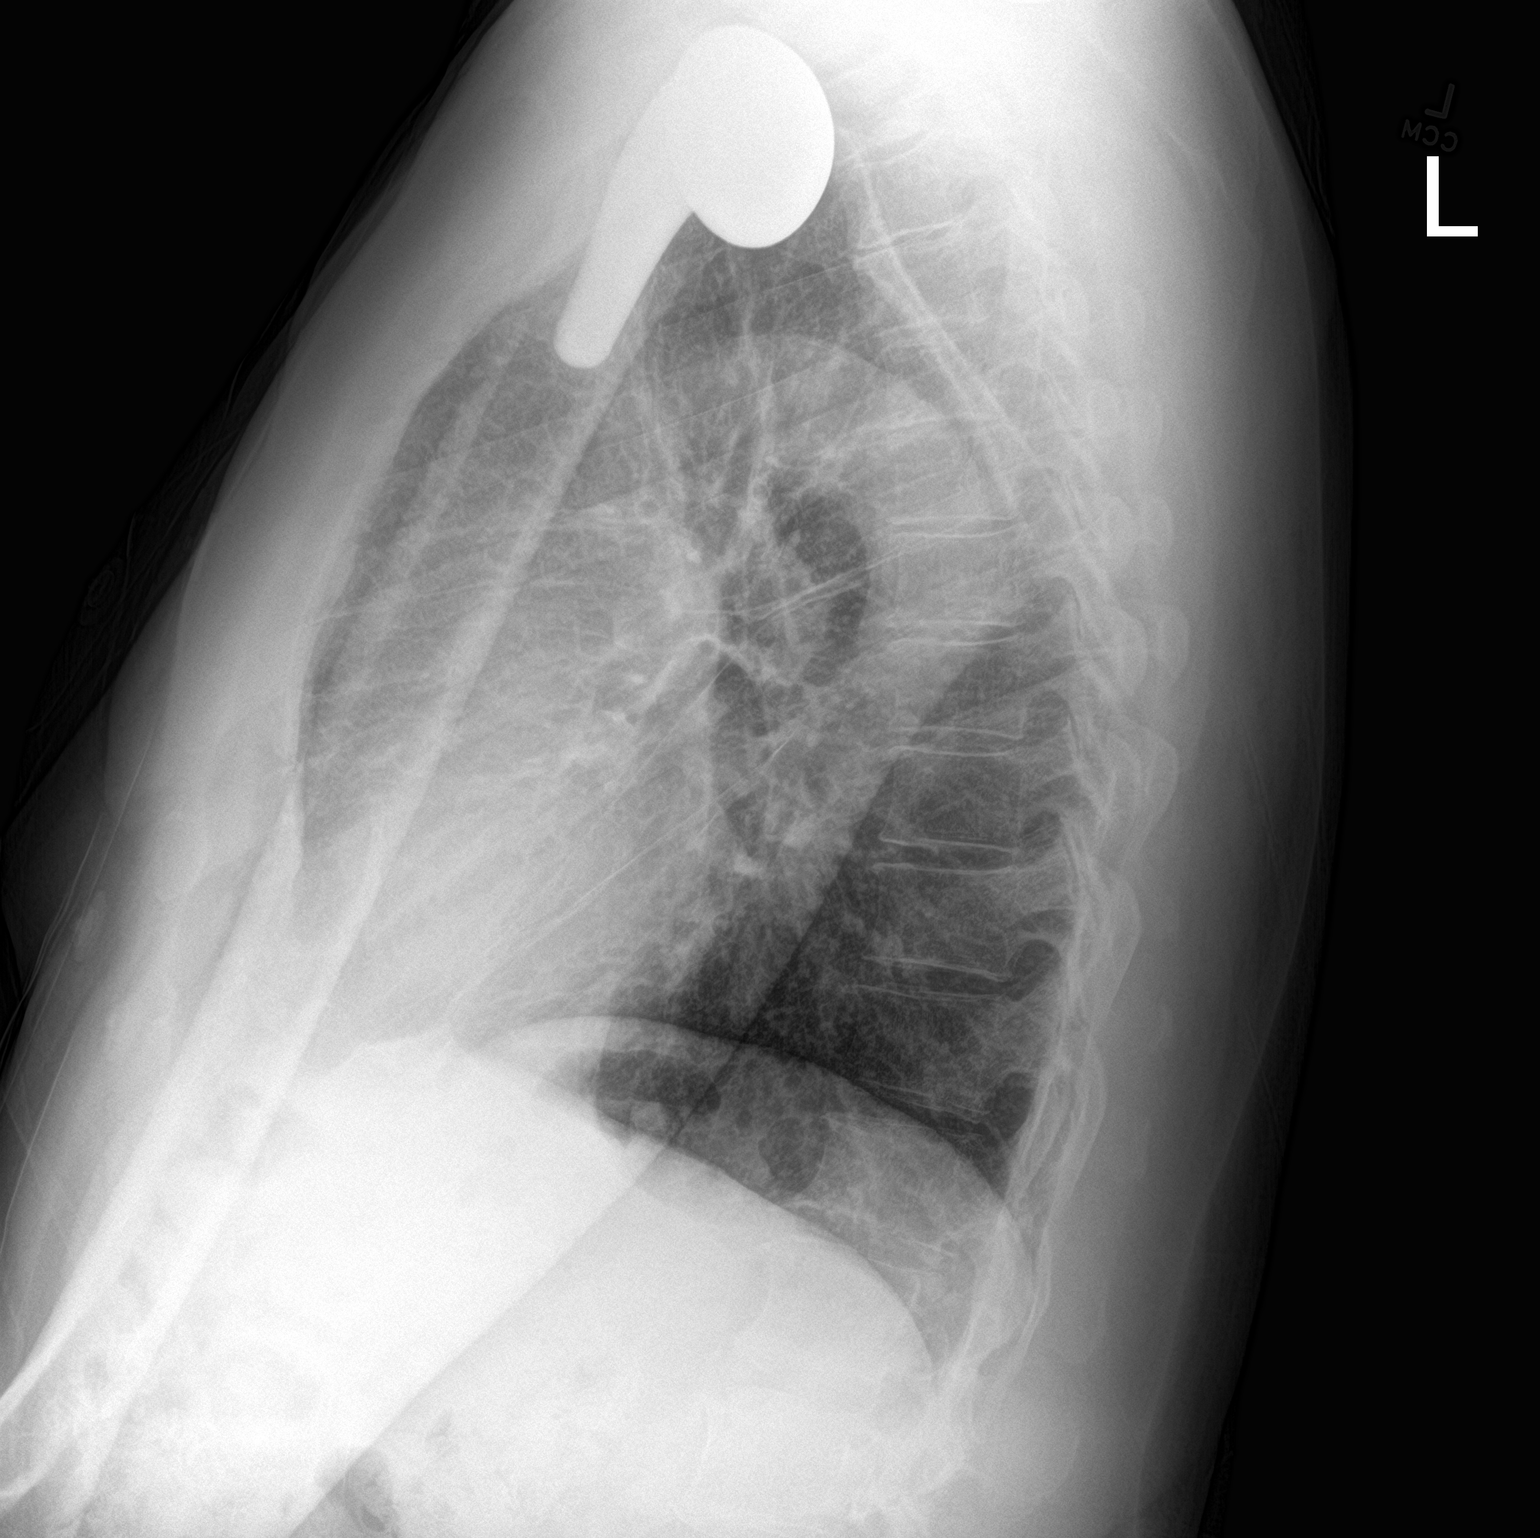

[2 of 2 positions shown; findings below may reference images not displayed]

FINDINGS: The lateral view is limited in evaluation secondary to positioning
of the patient's upper extremity. The heart size and mediastinal
contours are within normal limits. Both lungs are clear. An intact
right shoulder replacement is seen. There is mild dextroscoliosis of
the lower thoracic and upper lumbar spine.
IMPRESSION: No active cardiopulmonary disease.
